# Patient Record
Sex: Female | Born: 1945 | Race: White | Hispanic: No | Marital: Married | Smoking: Former smoker
Health system: Southern US, Community
[De-identification: ages and names within clinical notes are randomized; demographics above are authoritative.]

## PROBLEM LIST (undated history)

## (undated) DIAGNOSIS — G2581 Restless legs syndrome: Secondary | ICD-10-CM

## (undated) DIAGNOSIS — F419 Anxiety disorder, unspecified: Secondary | ICD-10-CM

## (undated) DIAGNOSIS — G20 Parkinson's disease: Secondary | ICD-10-CM

## (undated) DIAGNOSIS — G709 Myoneural disorder, unspecified: Secondary | ICD-10-CM

## (undated) DIAGNOSIS — R7303 Prediabetes: Secondary | ICD-10-CM

## (undated) DIAGNOSIS — R42 Dizziness and giddiness: Secondary | ICD-10-CM

## (undated) DIAGNOSIS — F329 Major depressive disorder, single episode, unspecified: Secondary | ICD-10-CM

## (undated) DIAGNOSIS — Z9889 Other specified postprocedural states: Secondary | ICD-10-CM

## (undated) DIAGNOSIS — I10 Essential (primary) hypertension: Secondary | ICD-10-CM

## (undated) DIAGNOSIS — D649 Anemia, unspecified: Secondary | ICD-10-CM

## (undated) DIAGNOSIS — K219 Gastro-esophageal reflux disease without esophagitis: Secondary | ICD-10-CM

## (undated) DIAGNOSIS — R112 Nausea with vomiting, unspecified: Secondary | ICD-10-CM

## (undated) DIAGNOSIS — J449 Chronic obstructive pulmonary disease, unspecified: Secondary | ICD-10-CM

## (undated) DIAGNOSIS — F32A Depression, unspecified: Secondary | ICD-10-CM

## (undated) DIAGNOSIS — G4730 Sleep apnea, unspecified: Secondary | ICD-10-CM

## (undated) DIAGNOSIS — K649 Unspecified hemorrhoids: Secondary | ICD-10-CM

## (undated) HISTORY — DX: Dizziness and giddiness: R42

## (undated) HISTORY — DX: Sleep apnea, unspecified: G47.30

## (undated) HISTORY — DX: Unspecified hemorrhoids: K64.9

## (undated) HISTORY — DX: Essential (primary) hypertension: I10

## (undated) HISTORY — DX: Parkinson's disease: G20

## (undated) HISTORY — PX: APPENDECTOMY: SHX54

## (undated) HISTORY — PX: ABDOMINAL HYSTERECTOMY: SHX81

---

## 2004-10-09 ENCOUNTER — Ambulatory Visit: Payer: Self-pay | Admitting: General Surgery

## 2005-02-13 ENCOUNTER — Ambulatory Visit: Payer: Self-pay | Admitting: Family Medicine

## 2006-08-01 ENCOUNTER — Ambulatory Visit: Payer: Self-pay | Admitting: Family Medicine

## 2007-08-08 ENCOUNTER — Ambulatory Visit: Payer: Self-pay | Admitting: Family Medicine

## 2007-08-20 ENCOUNTER — Ambulatory Visit: Payer: Self-pay | Admitting: Family Medicine

## 2008-05-28 HISTORY — PX: COLONOSCOPY: SHX174

## 2008-08-04 ENCOUNTER — Ambulatory Visit: Payer: Self-pay | Admitting: Family Medicine

## 2009-03-23 ENCOUNTER — Ambulatory Visit: Payer: Self-pay | Admitting: Family Medicine

## 2009-08-09 ENCOUNTER — Ambulatory Visit: Payer: Self-pay | Admitting: Family Medicine

## 2010-08-22 ENCOUNTER — Ambulatory Visit: Payer: Self-pay | Admitting: Family Medicine

## 2010-09-21 ENCOUNTER — Ambulatory Visit: Payer: Self-pay | Admitting: Family Medicine

## 2010-10-17 ENCOUNTER — Ambulatory Visit: Payer: Self-pay | Admitting: General Surgery

## 2010-10-19 LAB — PATHOLOGY REPORT

## 2010-11-28 ENCOUNTER — Ambulatory Visit: Payer: Self-pay | Admitting: Family Medicine

## 2011-04-17 ENCOUNTER — Ambulatory Visit: Payer: Self-pay | Admitting: General Surgery

## 2011-09-06 ENCOUNTER — Ambulatory Visit: Payer: Self-pay | Admitting: Family Medicine

## 2012-05-28 DIAGNOSIS — G20 Parkinson's disease: Secondary | ICD-10-CM

## 2012-05-28 DIAGNOSIS — G20A1 Parkinson's disease without dyskinesia, without mention of fluctuations: Secondary | ICD-10-CM

## 2012-05-28 HISTORY — DX: Parkinson's disease without dyskinesia, without mention of fluctuations: G20.A1

## 2012-05-28 HISTORY — DX: Parkinson's disease: G20

## 2012-09-09 ENCOUNTER — Ambulatory Visit: Payer: Self-pay | Admitting: Family Medicine

## 2013-09-08 ENCOUNTER — Ambulatory Visit (INDEPENDENT_AMBULATORY_CARE_PROVIDER_SITE_OTHER): Payer: BC Managed Care – PPO | Admitting: General Surgery

## 2013-09-08 ENCOUNTER — Encounter: Payer: Self-pay | Admitting: General Surgery

## 2013-09-08 VITALS — BP 130/70 | HR 72 | Resp 14 | Ht 67.0 in | Wt 211.0 lb

## 2013-09-08 DIAGNOSIS — K625 Hemorrhage of anus and rectum: Secondary | ICD-10-CM

## 2013-09-08 LAB — POC HEMOCCULT BLD/STL (OFFICE/1-CARD/DIAGNOSTIC): Fecal Occult Blood, POC: NEGATIVE

## 2013-09-08 NOTE — Progress Notes (Signed)
Patient ID: Lynn Bauer, female   DOB: 06/25/1945, 68 y.o.   MRN: [id number]  Chief Complaint  Patient presents with  . Other    hemorrhoids    HPI Lynn Bauer is a 68 y.o. female here today for a evaluation of hemorrhoids.Patient states they have been there for about 30 years.She states they bleed off and on. "Bright red blood" Have not notice any in her stool.She states she is always constipated, not having any abdominal pain.  The patient was started on Linzess one month ago. She has noticed a slight improvement in her bowel habits.  She had been making use of fiber supplements on an as needed basis.    HPI  Past Medical History  Diagnosis Date  . Hypertension   . Diabetes mellitus without complication   . Parkinson disease 2014  . Vertigo   . Hemorrhoids     Past Surgical History  Procedure Laterality Date  . Colonoscopy  2010  . Abdominal hysterectomy      No family history on file.  Social History History  Substance Use Topics  . Smoking status: Never Smoker   . Smokeless tobacco: Never Used  . Alcohol Use: No    Allergies  Allergen Reactions  . Erythromycin Hives  . Penicillins Hives    Current Outpatient Prescriptions  Medication Sig Dispense Refill  . ALPRAZolam (XANAX) 0.5 MG tablet Take 0.5 mg by mouth 3 (three) times daily as needed for anxiety.      Marland Kitchen. aspirin 81 MG tablet Take 81 mg by mouth daily.      . cholecalciferol (VITAMIN D) 400 UNITS TABS tablet Take 400 Units by mouth daily.      Marland Kitchen. docusate sodium (COLACE) 100 MG capsule Take 100 mg by mouth 2 (two) times daily.      . Linaclotide (LINZESS) 145 MCG CAPS capsule Take 145 mcg by mouth daily.      . Misc Natural Products (FIBER 7 PO) Take 2 tablets by mouth daily.      Marland Kitchen. PARoxetine (PAXIL) 10 MG tablet Take 10 mg by mouth 3 (three) times daily.      . rosuvastatin (CRESTOR) 10 MG tablet Take 10 mg by mouth daily.      Marland Kitchen. telmisartan (MICARDIS) 80 MG tablet Take 80 mg by mouth daily.      Marland Kitchen.  triamterene-hydrochlorothiazide (MAXZIDE) 75-50 MG per tablet Take 0.5 tablets by mouth daily.       No current facility-administered medications for this visit.    Review of Systems Review of Systems  Constitutional: Negative.   Respiratory: Negative.   Cardiovascular: Negative.   Gastrointestinal: Positive for constipation. Negative for nausea, vomiting, abdominal pain, diarrhea and abdominal distention.    Blood pressure 130/70, pulse 72, resp. rate 14, height 5\' 7"  (1.702 m), weight 211 lb (95.709 kg).  Physical Exam Physical Exam  Constitutional: She is oriented to person, place, and time. She appears well-developed and well-nourished.  Eyes: Conjunctivae are normal.  Neck: Neck supple.  Cardiovascular: Normal rate, regular rhythm and normal heart sounds.   Pulmonary/Chest: Effort normal and breath sounds normal.  Abdominal: Soft. Normal appearance and bowel sounds are normal. There is no tenderness.  Genitourinary: Rectal exam shows internal hemorrhoid.  Neurological: She is alert and oriented to person, place, and time.  Skin: Skin is warm and dry.     Right lateral calf a 5 by 10 wound .    Data Reviewed Anoscopy showed internal hemorrhoids. Stool  is Hemoccult negative. No active bleeding. No rectal masses. No anal fissure. Minimal external component. Very slight mucosal prolapse with vigorous straining. PCP notes of 08/20/2013 reviewed.  Assessment    Anorectal source for bleeding. (Colonoscopy 2012).    Plan    The patient was asked to make use of 2 of her fiber capsules twice a day with a glass of water each time. She'll continue her Linzess. We'll plan for reassessment in one month. If we can avoid straining, she may not require surgical intervention.    PCP: Mauricia AreaMorrisey, [place]    Gisel Vipond W Adedamola Seto 09/09/2013, 4:35 PM

## 2013-09-08 NOTE — Patient Instructions (Addendum)
The patient will be encouraged to make use of a daily fiber supplement two times daily with a glass of water.

## 2013-09-09 ENCOUNTER — Ambulatory Visit: Payer: Self-pay | Admitting: Family Medicine

## 2013-09-09 DIAGNOSIS — K625 Hemorrhage of anus and rectum: Secondary | ICD-10-CM | POA: Insufficient documentation

## 2013-09-14 ENCOUNTER — Ambulatory Visit: Payer: Self-pay | Admitting: Family Medicine

## 2013-10-07 ENCOUNTER — Ambulatory Visit: Payer: BC Managed Care – PPO | Admitting: General Surgery

## 2013-12-24 ENCOUNTER — Ambulatory Visit: Payer: Self-pay | Admitting: Family Medicine

## 2014-03-25 ENCOUNTER — Ambulatory Visit: Payer: Self-pay | Admitting: Family Medicine

## 2014-03-29 ENCOUNTER — Encounter: Payer: Self-pay | Admitting: General Surgery

## 2014-05-24 ENCOUNTER — Ambulatory Visit: Payer: Self-pay | Admitting: Family Medicine

## 2014-05-31 DIAGNOSIS — S83411D Sprain of medial collateral ligament of right knee, subsequent encounter: Secondary | ICD-10-CM | POA: Diagnosis not present

## 2014-06-21 DIAGNOSIS — J069 Acute upper respiratory infection, unspecified: Secondary | ICD-10-CM | POA: Diagnosis not present

## 2014-06-21 DIAGNOSIS — J329 Chronic sinusitis, unspecified: Secondary | ICD-10-CM | POA: Diagnosis not present

## 2014-08-04 DIAGNOSIS — J309 Allergic rhinitis, unspecified: Secondary | ICD-10-CM | POA: Diagnosis not present

## 2014-08-04 DIAGNOSIS — R42 Dizziness and giddiness: Secondary | ICD-10-CM | POA: Diagnosis not present

## 2014-08-04 DIAGNOSIS — I10 Essential (primary) hypertension: Secondary | ICD-10-CM | POA: Diagnosis not present

## 2014-08-27 DIAGNOSIS — E119 Type 2 diabetes mellitus without complications: Secondary | ICD-10-CM | POA: Diagnosis not present

## 2014-10-08 DIAGNOSIS — S93621D Sprain of tarsometatarsal ligament of right foot, subsequent encounter: Secondary | ICD-10-CM | POA: Diagnosis not present

## 2014-11-16 ENCOUNTER — Encounter: Payer: Self-pay | Admitting: Family Medicine

## 2014-11-16 ENCOUNTER — Ambulatory Visit (INDEPENDENT_AMBULATORY_CARE_PROVIDER_SITE_OTHER): Payer: Medicare Other | Admitting: Family Medicine

## 2014-11-16 VITALS — BP 120/70 | HR 83 | Temp 98.2°F | Ht 68.0 in | Wt 202.0 lb

## 2014-11-16 DIAGNOSIS — R05 Cough: Secondary | ICD-10-CM

## 2014-11-16 DIAGNOSIS — J029 Acute pharyngitis, unspecified: Secondary | ICD-10-CM | POA: Diagnosis not present

## 2014-11-16 DIAGNOSIS — R059 Cough, unspecified: Secondary | ICD-10-CM

## 2014-11-16 LAB — POCT RAPID STREP A (OFFICE): Rapid Strep A Screen: NEGATIVE

## 2014-11-16 MED ORDER — CLINDAMYCIN HCL 300 MG PO CAPS
300.0000 mg | ORAL_CAPSULE | Freq: Three times a day (TID) | ORAL | Status: AC
Start: 2014-11-16 — End: 2014-11-26

## 2014-11-16 MED ORDER — BENZONATATE 200 MG PO CAPS: 200.0000 mg | ORAL_CAPSULE | Freq: Three times a day (TID) | ORAL | Status: DC | PRN

## 2014-11-16 NOTE — Progress Notes (Signed)
Name: Lynn Bauer   MRN: [id number]    DOB: 1945-12-12   Date:11/16/2014       Progress Note  Subjective  Chief Complaint  Chief Complaint  Patient presents with  . coughing    has been going on for about 2 weeks, has had sore throat and chills at night with heat spells.    Sore Throat  This is a new problem. The current episode started 1 to 4 weeks ago. The problem has been unchanged. The pain is worse on the right (middle) side. There has been no fever. Associated symptoms include coughing, ear pain, headaches and a hoarse voice. Pertinent negatives include no shortness of breath or swollen glands. She has had exposure to strep (at a graduation party for kindergartners.). She has tried acetaminophen for the symptoms. The treatment provided mild relief.      Past Medical History  Diagnosis Date  . Hypertension   . Diabetes mellitus without complication   . Parkinson disease 2014  . Vertigo   . Hemorrhoids     Past Surgical History  Procedure Laterality Date  . Colonoscopy  2010  . Abdominal hysterectomy      History reviewed. No pertinent family history.  History   Social History  . Marital Status: Married    Spouse Name: N/A  . Number of Children: N/A  . Years of Education: N/A   Occupational History  . Not on file.   Social History Main Topics  . Smoking status: Never Smoker   . Smokeless tobacco: Never Used  . Alcohol Use: No  . Drug Use: No  . Sexual Activity: Not on file   Other Topics Concern  . Not on file   Social History Narrative     Current outpatient prescriptions:  .  ALPRAZolam (XANAX) 0.5 MG tablet, Take 0.5 mg by mouth 3 (three) times daily as needed for anxiety., Disp: , Rfl:  .  aspirin 81 MG tablet, Take 81 mg by mouth daily., Disp: , Rfl:  .  atorvastatin (LIPITOR) 20 MG tablet, , Disp: , Rfl:  .  cholecalciferol (VITAMIN D) 400 UNITS TABS tablet, Take 400 Units by mouth daily., Disp: , Rfl:  .  citalopram (CELEXA) 10 MG tablet, ,  Disp: , Rfl:  .  hydrochlorothiazide (HYDRODIURIL) 12.5 MG tablet, , Disp: , Rfl:  .  Linaclotide (LINZESS) 145 MCG CAPS capsule, Take 145 mcg by mouth daily., Disp: , Rfl:  .  Misc Natural Products (FIBER 7 PO), Take 2 tablets by mouth daily., Disp: , Rfl:  .  telmisartan (MICARDIS) 80 MG tablet, Take 80 mg by mouth daily., Disp: , Rfl:  .  docusate sodium (COLACE) 100 MG capsule, Take 100 mg by mouth 2 (two) times daily., Disp: , Rfl:  .  PARoxetine (PAXIL) 10 MG tablet, Take 10 mg by mouth 3 (three) times daily., Disp: , Rfl:  .  rosuvastatin (CRESTOR) 10 MG tablet, Take 10 mg by mouth daily., Disp: , Rfl:  .  triamterene-hydrochlorothiazide (MAXZIDE) 75-50 MG per tablet, Take 0.5 tablets by mouth daily., Disp: , Rfl:   Allergies  Allergen Reactions  . Erythromycin Hives  . Penicillins Hives     Review of Systems  HENT: Positive for ear pain and hoarse voice.   Respiratory: Positive for cough. Negative for shortness of breath.   Neurological: Positive for headaches.      Objective  Filed Vitals:   11/16/14 0812  BP: 120/70  Pulse: 83  Temp: 98.2  F (36.8 C)  TempSrc: Oral  Height:  (1.727 m)  Weight: 202 lb (91.627 kg)    Physical Exam  Constitutional: She is well-developed, well-nourished, and in no distress.  HENT:  Head: Normocephalic and atraumatic.  Right Ear: External ear normal.  Left Ear: External ear normal.  Nose: Right sinus exhibits frontal sinus tenderness. Left sinus exhibits frontal sinus tenderness.  Mouth/Throat: Uvula is midline. Posterior oropharyngeal erythema present.  Neck: Neck supple.  Cardiovascular: Normal rate and regular rhythm.   Pulmonary/Chest: Effort normal and breath sounds normal.  Nursing note and vitals reviewed.      No results found for this or any previous visit (from the past 2160 hour(s)).   Assessment & Plan 1. Sore throat Point-of-care strep testing is negative. However, based on patient's symptoms and  exposure to confirmed strep carriers, we will prescribe antibiotic therapy for pharyngitis. - POCT rapid strep A - clindamycin (CLEOCIN) 300 MG capsule; Take 1 capsule (300 mg total) by mouth 3 (three) times daily.  Dispense: 30 capsule; Refill: 0  2. Cough  - benzonatate (TESSALON) 200 MG capsule; Take 1 capsule (200 mg total) by mouth 3 (three) times daily as needed for cough.  Dispense: 20 capsule; Refill: 0  There are no diagnoses linked to this encounter.  Zymir Napoli [facility] 11/16/2014 8:26 AM

## 2014-11-22 ENCOUNTER — Encounter: Payer: Self-pay | Admitting: Family Medicine

## 2014-11-22 ENCOUNTER — Telehealth: Payer: Self-pay | Admitting: Family Medicine

## 2014-11-22 ENCOUNTER — Ambulatory Visit (INDEPENDENT_AMBULATORY_CARE_PROVIDER_SITE_OTHER): Payer: Medicare Other | Admitting: Family Medicine

## 2014-11-22 DIAGNOSIS — F329 Major depressive disorder, single episode, unspecified: Secondary | ICD-10-CM

## 2014-11-22 DIAGNOSIS — F515 Nightmare disorder: Secondary | ICD-10-CM

## 2014-11-22 DIAGNOSIS — F419 Anxiety disorder, unspecified: Secondary | ICD-10-CM | POA: Diagnosis not present

## 2014-11-22 DIAGNOSIS — F32A Depression, unspecified: Secondary | ICD-10-CM | POA: Insufficient documentation

## 2014-11-22 MED ORDER — PAROXETINE HCL 10 MG PO TABS: 10.0000 mg | ORAL_TABLET | Freq: Three times a day (TID) | ORAL | Status: DC

## 2014-11-22 MED ORDER — ARIPIPRAZOLE 2 MG PO TABS: 2.0000 mg | ORAL_TABLET | Freq: Every day | ORAL | Status: DC

## 2014-11-22 NOTE — Addendum Note (Signed)
Addended by: Dennison MascotMORRISEY, Mahathi Pokorney on: 11/22/2014 02:41 PM   Modules accepted: Level of Service, SmartSet

## 2014-11-22 NOTE — Progress Notes (Signed)
Name: Lynn Bauer   MRN: [id number]    DOB: 19-Oct-1945   Date:11/22/2014       Progress Note  Subjective  Chief Complaint  Chief Complaint  Patient presents with  . Annual Exam    Anxiety Symptoms include nervous/anxious behavior. Patient reports no chest pain, dizziness, nausea, palpitations or shortness of breath.    Neurologic Problem The patient's primary symptoms include clumsiness. The patient's pertinent negatives include no focal weakness or weakness. This is a chronic problem. The current episode started more than 1 year ago. The neurological problem developed insidiously. The problem has been gradually worsening since onset. There was no focality noted. Pertinent negatives include no back pain, chest pain, dizziness, fever, headaches, nausea, neck pain, palpitations, shortness of breath or vomiting. Past treatments include nothing (Refused anti-Parkinson's drug by neurologist). Her past medical history is significant for mood changes.   NIGHTMARES  PT IS CURRENTLY EXPERIENCING VIVID NIGHTMARES THAT ALWAYS CONSIST OF SCENES OF CHILDREN GETTING LOST OR BEING IN DANGER AND SHE IS UNABLE TO GET TO THEM TO HELP,  SHE DENIES ANY RECENT TRAUMATIC EVENT.     Past Medical History  Diagnosis Date  . Hypertension   . Diabetes mellitus without complication   . Parkinson disease 2014  . Vertigo   . Hemorrhoids     History  Substance Use Topics  . Smoking status: Never Smoker   . Smokeless tobacco: Never Used  . Alcohol Use: No     Current outpatient prescriptions:  .  ALPRAZolam (XANAX) 0.5 MG tablet, Take 0.5 mg by mouth 3 (three) times daily as needed for anxiety., Disp: , Rfl:  .  aspirin 81 MG tablet, Take 81 mg by mouth daily., Disp: , Rfl:  .  atorvastatin (LIPITOR) 20 MG tablet, , Disp: , Rfl:  .  benzonatate (TESSALON) 200 MG capsule, Take 1 capsule (200 mg total) by mouth 3 (three) times daily as needed for cough., Disp: 20 capsule, Rfl: 0 .  cholecalciferol  (VITAMIN D) 400 UNITS TABS tablet, Take 400 Units by mouth daily., Disp: , Rfl:  .  citalopram (CELEXA) 10 MG tablet, , Disp: , Rfl:  .  clindamycin (CLEOCIN) 300 MG capsule, Take 1 capsule (300 mg total) by mouth 3 (three) times daily., Disp: 30 capsule, Rfl: 0 .  docusate sodium (COLACE) 100 MG capsule, Take 100 mg by mouth 2 (two) times daily., Disp: , Rfl:  .  Linaclotide (LINZESS) 145 MCG CAPS capsule, Take 145 mcg by mouth daily., Disp: , Rfl:  .  Misc Natural Products (FIBER 7 PO), Take 2 tablets by mouth daily., Disp: , Rfl:  .  PARoxetine (PAXIL) 10 MG tablet, Take 10 mg by mouth 3 (three) times daily., Disp: , Rfl:  .  rosuvastatin (CRESTOR) 10 MG tablet, Take 10 mg by mouth daily., Disp: , Rfl:  .  telmisartan (MICARDIS) 80 MG tablet, Take 80 mg by mouth daily., Disp: , Rfl:  .  triamterene-hydrochlorothiazide (MAXZIDE) 75-50 MG per tablet, Take 0.5 tablets by mouth daily., Disp: , Rfl:  .  hydrochlorothiazide (HYDRODIURIL) 12.5 MG tablet, , Disp: , Rfl:   Allergies  Allergen Reactions  . Erythromycin Hives  . Penicillins Hives    Review of Systems  Constitutional: Negative for fever, chills and weight loss.  HENT: Negative for congestion, hearing loss and tinnitus.   Eyes: Negative for blurred vision, double vision and redness.  Respiratory: Negative for cough, hemoptysis and shortness of breath.   Cardiovascular: Negative for chest  pain, palpitations, orthopnea, claudication and leg swelling.  Gastrointestinal: Negative for heartburn, nausea, vomiting, diarrhea, constipation and blood in stool.  Genitourinary: Negative for dysuria, urgency, frequency and hematuria.  Musculoskeletal: Positive for joint pain. Negative for myalgias, back pain, falls and neck pain.  Skin: Negative for itching.  Neurological: Positive for tremors (secondary to Parkinson's disease for which she has refused treatment). Negative for dizziness, tingling, focal weakness, seizures, loss of  consciousness, weakness and headaches.  Endo/Heme/Allergies: Does not bruise/bleed easily.  Psychiatric/Behavioral: Negative for depression and substance abuse. The patient is nervous/anxious.      Objective  Filed Vitals:   11/22/14 1332  BP: 120/76  Pulse: 78  Temp: 98.6 F (37 C)  TempSrc: Oral  Resp: 16  Height:  (1.727 m)  Weight: 201 lb 4.8 oz (91.309 kg)  SpO2: 97%     Physical Exam  Constitutional: She is oriented to person, place, and time and well-developed, well-nourished, and in no distress.  HENT:  Head: Normocephalic.  Eyes: EOM are normal. Pupils are equal, round, and reactive to light.  Neck: Normal range of motion. No thyromegaly present.  Cardiovascular: Normal rate, regular rhythm and normal heart sounds.   No murmur heard. Pulmonary/Chest: Effort normal and breath sounds normal.  Abdominal: Soft. Bowel sounds are normal.  Musculoskeletal: Normal range of motion. She exhibits no edema.  Neurological: She is alert and oriented to person, place, and time. No cranial nerve deficit. Gait normal.  Skin: Skin is warm and dry. No rash noted.  Psychiatric: Memory and affect normal.      Assessment & Plan  1. Depression  still not at baseline - ARIPiprazole (ABILIFY) 2 MG tablet; Take 1 tablet (2 mg total) by mouth daily.  Dispense: 30 tablet; Refill: 1 - PARoxetine (PAXIL) 10 MG tablet; Take 1 tablet (10 mg total) by mouth 3 (three) times daily.  Dispense: 90 tablet; Refill: 1  2. Acute anxiety Switch from celexa back to PAXIL - PARoxetine (PAXIL) 10 MG tablet; Take 1 tablet (10 mg total) by mouth 3 (three) times daily.  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  Dispense: 30 tablet; Refill: 1

## 2014-11-22 NOTE — Telephone Encounter (Signed)
Pt went to pharmacy to pick up rx for Abilify and when she tried to use the discount card, they told her that she was out of the age range (6-64) to use it. Rx will be $100/mo and pt said that she wasn't able to pay that so she won't be taking the rx. She wanted to make the MD aware.

## 2014-11-22 NOTE — Patient Instructions (Addendum)
FU in 1 mo.

## 2014-12-21 ENCOUNTER — Other Ambulatory Visit: Payer: Self-pay

## 2014-12-21 NOTE — Telephone Encounter (Signed)
Refill request was sent to Dr. Edwena Felty for approval and submission.  ALPRAZolam 0.5MG , 1/2 -1 Tablet two times daily, as needed, #60, 30 days starting 09/24/2014, Ref. x2. Active. Comments: DX: 300.3 Recorded 09/24/2014 11:40 AM by Edwena Felty, MD, Annotation/Addendum.

## 2014-12-22 ENCOUNTER — Other Ambulatory Visit: Payer: Self-pay

## 2014-12-22 MED ORDER — ALPRAZOLAM 0.5 MG PO TABS: 0.5000 mg | ORAL_TABLET | Freq: Three times a day (TID) | ORAL | Status: DC | PRN

## 2014-12-23 ENCOUNTER — Ambulatory Visit: Payer: Medicare Other | Admitting: Family Medicine

## 2015-01-13 ENCOUNTER — Telehealth: Payer: Self-pay | Admitting: Family Medicine

## 2015-01-13 MED ORDER — TELMISARTAN 80 MG PO TABS: 80.0000 mg | ORAL_TABLET | Freq: Every day | ORAL | Status: DC

## 2015-01-13 NOTE — Telephone Encounter (Signed)
Patient informed. 

## 2015-01-13 NOTE — Telephone Encounter (Signed)
Patient is requesting a refill on Telmisartan . Please send to [facility].

## 2015-01-13 NOTE — Telephone Encounter (Signed)
Sent to pharmacy and called pharmacy to verify receipt

## 2015-01-27 ENCOUNTER — Other Ambulatory Visit: Payer: Self-pay

## 2015-01-27 ENCOUNTER — Encounter: Payer: Self-pay | Admitting: Family Medicine

## 2015-01-27 ENCOUNTER — Encounter (INDEPENDENT_AMBULATORY_CARE_PROVIDER_SITE_OTHER): Payer: Self-pay

## 2015-01-27 ENCOUNTER — Ambulatory Visit (INDEPENDENT_AMBULATORY_CARE_PROVIDER_SITE_OTHER): Payer: Medicare Other | Admitting: Family Medicine

## 2015-01-27 VITALS — BP 130/72 | HR 78 | Temp 98.8°F | Resp 16 | Ht 68.0 in | Wt 200.9 lb

## 2015-01-27 DIAGNOSIS — Z23 Encounter for immunization: Secondary | ICD-10-CM

## 2015-01-27 DIAGNOSIS — Z0000 Encounter for general adult medical examination without abnormal findings: Secondary | ICD-10-CM | POA: Diagnosis not present

## 2015-01-27 MED ORDER — ATORVASTATIN CALCIUM 20 MG PO TABS: 20.0000 mg | ORAL_TABLET | Freq: Every day | ORAL | Status: DC

## 2015-01-27 NOTE — Patient Instructions (Signed)
RTO in one to 2 months for lab work and routine chronic disease follow-up

## 2015-01-27 NOTE — Progress Notes (Signed)
Name: Lynn Bauer   MRN: [id number]    DOB: May 21, 1946   Date:01/27/2015       Progress Note  Subjective  Chief Complaint  Chief Complaint  Patient presents with  . Annual Exam    HPI  69 year old white female presenting for annual H&P. Her baseline problems as per history of present illness are basically unchanged.  Past Medical History  Diagnosis Date  . Hypertension   . Diabetes mellitus without complication   . Parkinson disease 2014  . Vertigo   . Hemorrhoids     Social History  Substance Use Topics  . Smoking status: Never Smoker   . Smokeless tobacco: Never Used  . Alcohol Use: No     Current outpatient prescriptions:  .  ALPRAZolam (XANAX) 0.5 MG tablet, Take 1 tablet (0.5 mg total) by mouth 3 (three) times daily as needed for anxiety., Disp: 90 tablet, Rfl: 0 .  ARIPiprazole (ABILIFY) 2 MG tablet, Take 1 tablet (2 mg total) by mouth daily., Disp: 30 tablet, Rfl: 1 .  aspirin 81 MG tablet, Take 81 mg by mouth daily., Disp: , Rfl:  .  atorvastatin (LIPITOR) 20 MG tablet, Take 1 tablet (20 mg total) by mouth at bedtime., Disp: 30 tablet, Rfl: 3 .  benzonatate (TESSALON) 200 MG capsule, Take 1 capsule (200 mg total) by mouth 3 (three) times daily as needed for cough., Disp: 20 capsule, Rfl: 0 .  cholecalciferol (VITAMIN D) 400 UNITS TABS tablet, Take 400 Units by mouth daily., Disp: , Rfl:  .  docusate sodium (COLACE) 100 MG capsule, Take 100 mg by mouth 2 (two) times daily., Disp: , Rfl:  .  hydrochlorothiazide (HYDRODIURIL) 12.5 MG tablet, , Disp: , Rfl:  .  Linaclotide (LINZESS) 145 MCG CAPS capsule, Take 145 mcg by mouth daily., Disp: , Rfl:  .  Misc Natural Products (FIBER 7 PO), Take 2 tablets by mouth daily., Disp: , Rfl:  .  PARoxetine (PAXIL) 10 MG tablet, Take 1 tablet (10 mg total) by mouth 3 (three) times daily., Disp: 90 tablet, Rfl: 1 .  rosuvastatin (CRESTOR) 10 MG tablet, Take 10 mg by mouth daily., Disp: , Rfl:  .  telmisartan (MICARDIS) 80 MG tablet,  Take 1 tablet (80 mg total) by mouth daily., Disp: 30 tablet, Rfl: 2 .  triamterene-hydrochlorothiazide (MAXZIDE) 75-50 MG per tablet, Take 0.5 tablets by mouth daily., Disp: , Rfl:   Allergies  Allergen Reactions  . Erythromycin Hives  . Penicillins Hives    Review of Systems  Constitutional: Negative for fever, chills and weight loss.  HENT: Negative for congestion, hearing loss, sore throat and tinnitus.   Eyes: Negative for blurred vision, double vision and redness.  Respiratory: Negative for cough, hemoptysis and shortness of breath.   Cardiovascular: Negative for chest pain, palpitations, orthopnea, claudication and leg swelling.  Gastrointestinal: Negative for heartburn, nausea, vomiting, diarrhea, constipation and blood in stool.  Genitourinary: Negative for dysuria, urgency, frequency and hematuria.  Musculoskeletal: Negative for myalgias, back pain, joint pain, falls and neck pain.  Skin: Negative for itching.  Neurological: Negative for dizziness, tingling, tremors, focal weakness, seizures, loss of consciousness, weakness and headaches.  Endo/Heme/Allergies: Does not bruise/bleed easily.  Psychiatric/Behavioral: Negative for depression and substance abuse. The patient is not nervous/anxious and does not have insomnia.      Objective  Filed Vitals:   01/27/15 1316  BP: 130/72  Pulse: 78  Temp: 98.8 F (37.1 C)  TempSrc: Oral  Resp: 16  Height:  5\' 8"  (1.727 m)  Weight: 200 lb 14.4 oz (91.128 kg)  SpO2: 96%     Physical Exam  Constitutional: She is oriented to person, place, and time and well-developed, well-nourished, and in no distress.  HENT:  Head: Normocephalic.  Eyes: EOM are normal. Pupils are equal, round, and reactive to light.  Neck: Normal range of motion. No thyromegaly present.  Cardiovascular: Normal rate, regular rhythm and normal heart sounds.   No murmur heard. Pulmonary/Chest: Effort normal and breath sounds normal.  Breasts are without  murmur to dimpling or axillary adenopathy.  Abdominal: Soft. Bowel sounds are normal.  Musculoskeletal: Normal range of motion. She exhibits no edema.  Neurological: She is alert and oriented to person, place, and time. No cranial nerve deficit.  Parkinson's type tremor is present with cogwheel rigidity. Somewhat masked facies  Skin: Skin is warm and dry. No rash noted.  Psychiatric: Memory and affect normal.      Assessment & Plan  1. Annual physical exam   2. Need for influenza vaccination  - Flu vaccine HIGH DOSE PF (Fluzone High dose)   3.  Parkinson's disease. Patient still refusing treatment

## 2015-02-02 ENCOUNTER — Other Ambulatory Visit: Payer: Self-pay

## 2015-02-02 MED ORDER — HYDROCHLOROTHIAZIDE 12.5 MG PO TABS: 12.5000 mg | ORAL_TABLET | Freq: Every day | ORAL | Status: DC

## 2015-02-08 ENCOUNTER — Other Ambulatory Visit: Payer: Self-pay

## 2015-02-08 MED ORDER — ALPRAZOLAM 0.5 MG PO TABS: 0.5000 mg | ORAL_TABLET | Freq: Three times a day (TID) | ORAL | Status: DC | PRN

## 2015-02-28 ENCOUNTER — Encounter: Payer: Self-pay | Admitting: Family Medicine

## 2015-02-28 ENCOUNTER — Ambulatory Visit (INDEPENDENT_AMBULATORY_CARE_PROVIDER_SITE_OTHER): Payer: Medicare Other | Admitting: Family Medicine

## 2015-02-28 VITALS — BP 118/68 | HR 97 | Temp 98.9°F | Resp 16 | Ht 68.0 in | Wt 197.4 lb

## 2015-02-28 DIAGNOSIS — H9319 Tinnitus, unspecified ear: Secondary | ICD-10-CM | POA: Diagnosis not present

## 2015-02-28 DIAGNOSIS — R51 Headache: Secondary | ICD-10-CM | POA: Diagnosis not present

## 2015-02-28 DIAGNOSIS — R238 Other skin changes: Secondary | ICD-10-CM

## 2015-02-28 DIAGNOSIS — R251 Tremor, unspecified: Secondary | ICD-10-CM

## 2015-02-28 DIAGNOSIS — R233 Spontaneous ecchymoses: Secondary | ICD-10-CM

## 2015-02-28 DIAGNOSIS — G20 Parkinson's disease: Secondary | ICD-10-CM | POA: Diagnosis not present

## 2015-02-28 DIAGNOSIS — R519 Headache, unspecified: Secondary | ICD-10-CM

## 2015-02-28 LAB — GLUCOSE, POCT (MANUAL RESULT ENTRY): POC Glucose: 108 mg/dl — AB (ref 70–99)

## 2015-02-28 NOTE — Progress Notes (Signed)
Name: Lynn Bauer   MRN: [id number]    DOB: April 17, 1946   Date:02/28/2015       Progress Note  Subjective  Chief Complaint  Chief Complaint  Patient presents with  . Blurred Vision  . Tinnitus    HPI  Tinnitus.  Patient complains of recurrent problem with tenderness. It is bilateral. There is no significant decrease in her hearing. There is no ear pain. It is of note as noted below she has Parkinson's disease as well  Visual problem.  Patient complains of recurrent problem with visual difficulty. She has had her glasses changed recently but still has trouble with focusing and is seeing double at times. There is no flashing lights. It is of it is of significance that she is also experiencing some headaches.  Parkinson's disease  Patient was diagnosed with Parkinson's disease several years ago. At that time she elected to to forego long-term therapy because of side effects she was experiencing. Over the years she is having increasing problem with tremor and now is having this more mental status problems with more forgetfulness more changes in her mood and attitude as well as increasing problem with ataxia and her ambulation. She is also experiencing headache of proportion she's never experienced before.  Headaches patient complains of bilateral headaches which are more severe than she's had before. There is no problem with any flashing lights but she is experiencing also some problem with some difficulty with her vision. She has been checked by an ophthalmologist and found no problems. Is not nervous about she said coronary disease.     Past Medical History  Diagnosis Date  . Hypertension   . Diabetes mellitus without complication ([place])   . Parkinson disease ([place]) 2014  . Vertigo   . Hemorrhoids     Social History  Substance Use Topics  . Smoking status: Never Smoker   . Smokeless tobacco: Never Used  . Alcohol Use: No     Current outpatient prescriptions:  .  ALPRAZolam  (XANAX) 0.5 MG tablet, Take 1 tablet (0.5 mg total) by mouth 3 (three) times daily as needed for anxiety., Disp: 90 tablet, Rfl: 0 .  aspirin 81 MG tablet, Take 81 mg by mouth daily., Disp: , Rfl:  .  atorvastatin (LIPITOR) 20 MG tablet, Take 1 tablet (20 mg total) by mouth at bedtime., Disp: 30 tablet, Rfl: 3 .  cholecalciferol (VITAMIN D) 400 UNITS TABS tablet, Take 400 Units by mouth daily., Disp: , Rfl:  .  docusate sodium (COLACE) 100 MG capsule, Take 100 mg by mouth 2 (two) times daily., Disp: , Rfl:  .  hydrochlorothiazide (HYDRODIURIL) 12.5 MG tablet, Take 1 tablet (12.5 mg total) by mouth daily., Disp: 30 tablet, Rfl: 3 .  Linaclotide (LINZESS) 145 MCG CAPS capsule, Take 145 mcg by mouth daily., Disp: , Rfl:  .  PARoxetine (PAXIL) 10 MG tablet, Take 1 tablet (10 mg total) by mouth 3 (three) times daily., Disp: 90 tablet, Rfl: 1 .  telmisartan (MICARDIS) 80 MG tablet, Take 1 tablet (80 mg total) by mouth daily., Disp: 30 tablet, Rfl: 2 .  ARIPiprazole (ABILIFY) 2 MG tablet, Take 1 tablet (2 mg total) by mouth daily. (Patient not taking: Reported on 02/28/2015), Disp: 30 tablet, Rfl: 1 .  benzonatate (TESSALON) 200 MG capsule, Take 1 capsule (200 mg total) by mouth 3 (three) times daily as needed for cough. (Patient not taking: Reported on 02/28/2015), Disp: 20 capsule, Rfl: 0 .  Misc Natural Products (FIBER  7 PO), Take 2 tablets by mouth daily., Disp: , Rfl:  .  rosuvastatin (CRESTOR) 10 MG tablet, Take 10 mg by mouth daily., Disp: , Rfl:  .  triamterene-hydrochlorothiazide (MAXZIDE) 75-50 MG per tablet, Take 0.5 tablets by mouth daily., Disp: , Rfl:   Allergies  Allergen Reactions  . Erythromycin Hives  . Penicillins Hives    Review of Systems  Constitutional: Positive for malaise/fatigue. Negative for fever, chills and weight loss.  HENT: Positive for tinnitus. Negative for congestion, hearing loss and sore throat.   Eyes: Positive for blurred vision, double vision and photophobia.  Negative for pain, discharge and redness.  Respiratory: Negative for cough, hemoptysis and shortness of breath.   Cardiovascular: Negative for chest pain, palpitations, orthopnea, claudication and leg swelling.  Gastrointestinal: Negative for heartburn, nausea, vomiting, diarrhea, constipation and blood in stool.  Genitourinary: Negative for dysuria, urgency, frequency and hematuria.  Musculoskeletal: Positive for joint pain. Negative for myalgias, back pain, falls and neck pain.  Skin: Negative for itching.  Neurological: Positive for dizziness, tremors, sensory change, speech change, weakness and headaches. Negative for tingling, focal weakness, seizures and loss of consciousness.  Endo/Heme/Allergies: Bruises/bleeds easily.  Psychiatric/Behavioral: Positive for depression and memory loss. Negative for suicidal ideas and substance abuse. The patient is nervous/anxious. The patient does not have insomnia.      Objective  Filed Vitals:   02/28/15 1510  BP: 118/68  Pulse: 97  Temp: 98.9 F (37.2 C)  Resp: 16  Height: _0  (1.727 m)  Weight: 197 lb 6 oz (89.529 kg)  SpO2: 99%     Physical Exam  Constitutional: She is well-developed, well-nourished, and in no distress.  Patient with gross tremor particularly of the right upper extremity consistent with Parkinson's disease. There is also masking of the face is with decreased smile and frowning ability  HENT:  Head: Normocephalic.  No temporal artery tenderness  Eyes: EOM are normal. Pupils are equal, round, and reactive to light.  Neck: Normal range of motion. No thyromegaly present.  Cardiovascular: Normal rate, regular rhythm and normal heart sounds.   No murmur heard. Pulmonary/Chest: Effort normal and breath sounds normal.  Abdominal: Soft. Bowel sounds are normal.  Musculoskeletal: Normal range of motion. She exhibits no edema.  Neurological: No cranial nerve deficit.  Patient has a gross right upper extremity Parkinson's  type tremor with pill-rolling in the right upper extremity. There is also some mild cogwheel rigidity of the right upper extremity. There is decreased expression ability and the face. With ambulation she has mild ataxia and a broad-based turn with ambulation. There is also decrease in tremor with intention. This is particularly responsive finger to nose movement.  Skin: Skin is warm and dry. No rash noted.  Psychiatric: Memory and affect normal.      Assessment & Plan  1. Tinnitus, unspecified laterality Stop aspirin for now  2. Parkinson's disease ([place]) Refer back to neurologist - CBC - Comprehensive Metabolic Panel (CMET) - Lipid Profile - TSH - POCT Glucose (CBG) - POCT HgB A1C  3. Tremor Refer back to neurologist - CBC - Comprehensive Metabolic Panel (CMET) - Lipid Profile - TSH - POCT Glucose (CBG) - POCT HgB A1C  4. Nonintractable headache, unspecified chronicity pattern, unspecified headache type Refer back to neurologist will be getting a workup for temporal arteritis - Sed Rate (ESR) - Methylmalonic Acid - Ambulatory referral to Neurology - POCT Glucose (CBG) - POCT HgB A1C  5. Easy bruisability Hold aspirin for now

## 2015-03-01 DIAGNOSIS — R51 Headache: Secondary | ICD-10-CM | POA: Diagnosis not present

## 2015-03-01 DIAGNOSIS — R251 Tremor, unspecified: Secondary | ICD-10-CM | POA: Diagnosis not present

## 2015-03-01 DIAGNOSIS — G20 Parkinson's disease: Secondary | ICD-10-CM | POA: Diagnosis not present

## 2015-03-01 LAB — CBC
HEMOGLOBIN: 12.6 g/dL (ref 11.1–15.9)
Hematocrit: 37.6 % (ref 34.0–46.6)
MCH: 30.7 pg (ref 26.6–33.0)
MCHC: 33.5 g/dL (ref 31.5–35.7)
MCV: 92 fL (ref 79–97)
NRBC: 0 % (ref 0–0)
Platelets: 180 10*3/uL (ref 150–379)
RBC: 4.11 x10E6/uL (ref 3.77–5.28)
RDW: 12.9 % (ref 12.3–15.4)
WBC: 4 10*3/uL (ref 3.4–10.8)

## 2015-03-01 LAB — SEDIMENTATION RATE: SED RATE: 3 mm/h (ref 0–40)

## 2015-03-02 LAB — COMPREHENSIVE METABOLIC PANEL
ALBUMIN: 4.4 g/dL (ref 3.6–4.8)
ALT: 12 IU/L (ref 0–32)
AST: 14 IU/L (ref 0–40)
Albumin/Globulin Ratio: 1.7 (ref 1.1–2.5)
Alkaline Phosphatase: 72 IU/L (ref 39–117)
BILIRUBIN TOTAL: 0.6 mg/dL (ref 0.0–1.2)
BUN / CREAT RATIO: 14 (ref 11–26)
BUN: 13 mg/dL (ref 8–27)
CHLORIDE: 99 mmol/L (ref 97–108)
CO2: 25 mmol/L (ref 18–29)
Calcium: 9.7 mg/dL (ref 8.7–10.3)
Creatinine, Ser: 0.92 mg/dL (ref 0.57–1.00)
GFR calc non Af Amer: 64 mL/min/{1.73_m2} (ref >59)
GFR, EST AFRICAN AMERICAN: 73 mL/min/{1.73_m2} (ref >59)
GLOBULIN, TOTAL: 2.6 g/dL (ref 1.5–4.5)
Glucose: 108 mg/dL — ABNORMAL HIGH (ref 65–99)
Potassium: 3.9 mmol/L (ref 3.5–5.2)
SODIUM: 141 mmol/L (ref 134–144)
Total Protein: 7 g/dL (ref 6.0–8.5)

## 2015-03-02 LAB — LIPID PANEL
CHOLESTEROL TOTAL: 143 mg/dL (ref 100–199)
Chol/HDL Ratio: 1.9 ratio units (ref 0.0–4.4)
HDL: 76 mg/dL (ref >39)
LDL Calculated: 47 mg/dL (ref 0–99)
Triglycerides: 100 mg/dL (ref 0–149)
VLDL Cholesterol Cal: 20 mg/dL (ref 5–40)

## 2015-03-02 LAB — TSH: TSH: 1.09 u[IU]/mL (ref 0.450–4.500)

## 2015-03-04 LAB — METHYLMALONIC ACID, SERUM: Methylmalonic Acid: 129 nmol/L (ref 0–378)

## 2015-03-07 ENCOUNTER — Encounter: Payer: Self-pay | Admitting: Family Medicine

## 2015-03-08 ENCOUNTER — Telehealth: Payer: Self-pay | Admitting: Emergency Medicine

## 2015-03-08 NOTE — Telephone Encounter (Signed)
Patient notified

## 2015-03-22 ENCOUNTER — Other Ambulatory Visit: Payer: Self-pay | Admitting: Family Medicine

## 2015-03-30 ENCOUNTER — Telehealth: Payer: Self-pay | Admitting: Family Medicine

## 2015-03-30 MED ORDER — LINACLOTIDE 145 MCG PO CAPS: 145.0000 ug | ORAL_CAPSULE | Freq: Every day | ORAL | Status: DC

## 2015-03-30 MED ORDER — ALPRAZOLAM 0.5 MG PO TABS: 0.5000 mg | ORAL_TABLET | Freq: Three times a day (TID) | ORAL | Status: DC | PRN

## 2015-03-30 NOTE — Telephone Encounter (Signed)
Next Appt: 04-07-15 Completely out of [place] and is also needing a refill on Alprazolam. Please send to [facility].

## 2015-04-07 ENCOUNTER — Ambulatory Visit: Payer: Medicare Other | Admitting: Family Medicine

## 2015-04-14 DIAGNOSIS — G20 Parkinson's disease: Secondary | ICD-10-CM | POA: Diagnosis not present

## 2015-04-14 DIAGNOSIS — E6609 Other obesity due to excess calories: Secondary | ICD-10-CM | POA: Diagnosis not present

## 2015-04-18 ENCOUNTER — Other Ambulatory Visit: Payer: Self-pay | Admitting: Emergency Medicine

## 2015-04-18 MED ORDER — TELMISARTAN 80 MG PO TABS: 80.0000 mg | ORAL_TABLET | Freq: Every day | ORAL | Status: DC

## 2015-04-19 ENCOUNTER — Other Ambulatory Visit: Payer: Self-pay | Admitting: Family Medicine

## 2015-04-19 NOTE — Telephone Encounter (Signed)
Patient requesting refill. 

## 2015-04-26 ENCOUNTER — Other Ambulatory Visit: Payer: Self-pay | Admitting: Family Medicine

## 2015-05-06 ENCOUNTER — Other Ambulatory Visit: Payer: Self-pay | Admitting: Family Medicine

## 2015-05-23 ENCOUNTER — Other Ambulatory Visit: Payer: Self-pay | Admitting: Family Medicine

## 2015-06-03 ENCOUNTER — Other Ambulatory Visit: Payer: Self-pay | Admitting: Family Medicine

## 2015-07-04 ENCOUNTER — Ambulatory Visit: Payer: Medicare Other | Admitting: Family Medicine

## 2015-07-20 ENCOUNTER — Other Ambulatory Visit: Payer: Self-pay

## 2015-07-27 ENCOUNTER — Other Ambulatory Visit: Payer: Self-pay | Admitting: Family Medicine

## 2015-08-16 ENCOUNTER — Other Ambulatory Visit: Payer: Self-pay | Admitting: Family Medicine

## 2015-08-19 ENCOUNTER — Other Ambulatory Visit: Payer: Self-pay

## 2015-08-19 MED ORDER — ALPRAZOLAM 0.5 MG PO TABS: ORAL_TABLET | ORAL | Status: AC

## 2015-08-23 ENCOUNTER — Telehealth: Payer: Self-pay | Admitting: Family Medicine

## 2015-08-23 NOTE — Telephone Encounter (Signed)
PLEASE CALL PT FOR SHE NEEDS TO BE ADVISED AS TO WHO CAN SEE HER.

## 2015-08-24 NOTE — Telephone Encounter (Signed)
Spoke with pt about what was going on with her. She stated that she has been having strange dreams and feeling shaky. Pt stated that [facility]. I looked up side effects of the medication and it did list strange dreams as a serious side effect so I informed pt and asked [facility] to [facility].

## 2015-08-29 ENCOUNTER — Other Ambulatory Visit: Payer: Self-pay

## 2015-08-29 ENCOUNTER — Other Ambulatory Visit: Payer: Self-pay | Admitting: Family Medicine

## 2015-08-29 DIAGNOSIS — Z1231 Encounter for screening mammogram for malignant neoplasm of breast: Secondary | ICD-10-CM

## 2015-08-29 MED ORDER — PAROXETINE HCL 10 MG PO TABS: 10.0000 mg | ORAL_TABLET | Freq: Three times a day (TID) | ORAL | Status: DC

## 2015-09-07 ENCOUNTER — Other Ambulatory Visit: Payer: Self-pay | Admitting: Family Medicine

## 2015-09-07 ENCOUNTER — Ambulatory Visit
Admission: RE | Admit: 2015-09-07 | Discharge: 2015-09-07 | Disposition: A | Discharge status: Home / Self Care | Payer: Medicare Other | Source: Ambulatory Visit | Attending: Family Medicine | Admitting: Family Medicine

## 2015-09-07 DIAGNOSIS — Z1231 Encounter for screening mammogram for malignant neoplasm of breast: Secondary | ICD-10-CM

## 2015-09-15 ENCOUNTER — Other Ambulatory Visit: Payer: Self-pay

## 2015-09-15 DIAGNOSIS — E119 Type 2 diabetes mellitus without complications: Secondary | ICD-10-CM | POA: Diagnosis not present

## 2015-09-15 DIAGNOSIS — G20 Parkinson's disease: Secondary | ICD-10-CM

## 2015-09-15 DIAGNOSIS — R251 Tremor, unspecified: Secondary | ICD-10-CM

## 2015-09-16 ENCOUNTER — Other Ambulatory Visit: Payer: Self-pay | Admitting: Family Medicine

## 2015-09-16 DIAGNOSIS — G20 Parkinson's disease: Secondary | ICD-10-CM | POA: Diagnosis not present

## 2015-09-16 DIAGNOSIS — R251 Tremor, unspecified: Secondary | ICD-10-CM | POA: Diagnosis not present

## 2015-09-17 LAB — COMPREHENSIVE METABOLIC PANEL
ALT: 24 IU/L (ref 0–32)
AST: 20 IU/L (ref 0–40)
Albumin/Globulin Ratio: 1.7 (ref 1.2–2.2)
Albumin: 4.5 g/dL (ref 3.6–4.8)
Alkaline Phosphatase: 87 IU/L (ref 39–117)
BUN/Creatinine Ratio: 13 (ref 12–28)
BUN: 12 mg/dL (ref 8–27)
Bilirubin Total: 0.7 mg/dL (ref 0.0–1.2)
CALCIUM: 9.8 mg/dL (ref 8.7–10.3)
CO2: 24 mmol/L (ref 18–29)
CREATININE: 0.96 mg/dL (ref 0.57–1.00)
Chloride: 97 mmol/L (ref 96–106)
GFR calc Af Amer: 70 mL/min/{1.73_m2} (ref >59)
GFR, EST NON AFRICAN AMERICAN: 61 mL/min/{1.73_m2} (ref >59)
Globulin, Total: 2.7 g/dL (ref 1.5–4.5)
Glucose: 100 mg/dL — ABNORMAL HIGH (ref 65–99)
Potassium: 4.2 mmol/L (ref 3.5–5.2)
Sodium: 137 mmol/L (ref 134–144)
Total Protein: 7.2 g/dL (ref 6.0–8.5)

## 2015-09-17 LAB — CBC
Hematocrit: 40.7 % (ref 34.0–46.6)
Hemoglobin: 13.9 g/dL (ref 11.1–15.9)
MCH: 30.9 pg (ref 26.6–33.0)
MCHC: 34.2 g/dL (ref 31.5–35.7)
MCV: 90 fL (ref 79–97)
Platelets: 189 10*3/uL (ref 150–379)
RBC: 4.5 x10E6/uL (ref 3.77–5.28)
RDW: 13.4 % (ref 12.3–15.4)
WBC: 5 10*3/uL (ref 3.4–10.8)

## 2015-09-17 LAB — LIPID PANEL
Chol/HDL Ratio: 1.8 ratio units (ref 0.0–4.4)
Cholesterol, Total: 148 mg/dL (ref 100–199)
HDL: 81 mg/dL (ref >39)
LDL CALC: 49 mg/dL (ref 0–99)
Triglycerides: 92 mg/dL (ref 0–149)
VLDL CHOLESTEROL CAL: 18 mg/dL (ref 5–40)

## 2015-09-17 LAB — HEMOGLOBIN A1C
ESTIMATED AVERAGE GLUCOSE: 117 mg/dL
HEMOGLOBIN A1C: 5.7 % — AB (ref 4.8–5.6)

## 2015-09-19 ENCOUNTER — Encounter: Payer: Self-pay | Admitting: Family Medicine

## 2015-09-26 ENCOUNTER — Telehealth: Payer: Self-pay | Admitting: Family Medicine

## 2015-09-26 NOTE — Telephone Encounter (Signed)
PT ASKING FOR RESULTS OF LABS °

## 2015-09-27 ENCOUNTER — Other Ambulatory Visit: Payer: Self-pay | Admitting: Family Medicine

## 2015-09-27 NOTE — Telephone Encounter (Signed)
Pt is aware of results and copy has been mailed

## 2015-09-28 ENCOUNTER — Telehealth: Payer: Self-pay | Admitting: Family Medicine

## 2015-09-28 NOTE — Telephone Encounter (Signed)
Pt needs refill on Paxil and is asking for a 90 day supply.

## 2015-09-29 MED ORDER — PAROXETINE HCL 10 MG PO TABS: 10.0000 mg | ORAL_TABLET | Freq: Three times a day (TID) | ORAL | Status: DC

## 2015-09-29 NOTE — Telephone Encounter (Signed)
Refill sent to pt pharmacy 

## 2015-09-29 NOTE — Telephone Encounter (Signed)
LVM to inform pt of med refill.

## 2015-10-05 ENCOUNTER — Other Ambulatory Visit: Payer: Self-pay | Admitting: Family Medicine

## 2015-10-12 DIAGNOSIS — G20 Parkinson's disease: Secondary | ICD-10-CM | POA: Diagnosis not present

## 2015-10-12 DIAGNOSIS — G4733 Obstructive sleep apnea (adult) (pediatric): Secondary | ICD-10-CM | POA: Diagnosis not present

## 2015-10-19 DIAGNOSIS — Z0000 Encounter for general adult medical examination without abnormal findings: Secondary | ICD-10-CM | POA: Diagnosis not present

## 2015-10-19 DIAGNOSIS — G20 Parkinson's disease: Secondary | ICD-10-CM | POA: Diagnosis not present

## 2015-11-01 ENCOUNTER — Encounter: Payer: Self-pay | Admitting: *Deleted

## 2015-11-07 ENCOUNTER — Ambulatory Visit: Payer: Self-pay | Admitting: General Surgery

## 2015-12-12 ENCOUNTER — Ambulatory Visit: Payer: Self-pay | Admitting: General Surgery

## 2016-01-09 ENCOUNTER — Encounter: Payer: Self-pay | Admitting: General Surgery

## 2016-01-10 ENCOUNTER — Ambulatory Visit (INDEPENDENT_AMBULATORY_CARE_PROVIDER_SITE_OTHER): Payer: Medicare Other | Admitting: General Surgery

## 2016-01-10 ENCOUNTER — Encounter: Payer: Self-pay | Admitting: General Surgery

## 2016-01-10 VITALS — BP 138/78 | HR 76 | Resp 12 | Ht 68.0 in | Wt 209.0 lb

## 2016-01-10 DIAGNOSIS — K648 Other hemorrhoids: Secondary | ICD-10-CM

## 2016-01-10 DIAGNOSIS — Z86010 Personal history of colonic polyps: Secondary | ICD-10-CM | POA: Diagnosis not present

## 2016-01-10 MED ORDER — POLYETHYLENE GLYCOL 3350 17 GM/SCOOP PO POWD: 1.0000 | Freq: Once | ORAL | 0 refills | Status: AC

## 2016-01-10 NOTE — Patient Instructions (Addendum)
Colonoscopy A colonoscopy is an exam to look at the entire large intestine (colon). This exam can help find problems such as tumors, polyps, inflammation, and areas of bleeding. The exam takes about 1 hour.  LET [facility] KNOW ABOUT:   Any allergies you have.  All medicines you are taking, including vitamins, herbs, eye drops, creams, and over-the-counter medicines.  Previous problems you or members of your family have had with the use of anesthetics.  Any blood disorders you have.  Previous surgeries you have had.  Medical conditions you have. RISKS AND COMPLICATIONS  Generally, this is a safe procedure. However, as with any procedure, complications can occur. Possible complications include:  Bleeding.  Tearing or rupture of the colon wall.  Reaction to medicines given during the exam.  Infection (rare). BEFORE THE PROCEDURE   Ask your health care provider about changing or stopping your regular medicines.  You may be prescribed an oral bowel prep. This involves drinking a large amount of medicated liquid, starting the day before your procedure. The liquid will cause you to have multiple loose stools until your stool is almost clear or light green. This cleans out your colon in preparation for the procedure.  Do not eat or drink anything else once you have started the bowel prep, unless your health care provider tells you it is safe to do so.  Arrange for someone to drive you home after the procedure. PROCEDURE   You will be given medicine to help you relax (sedative).  You will lie on your side with your knees bent.  A long, flexible tube with a light and camera on the end (colonoscope) will be inserted through the rectum and into the colon. The camera sends video back to a computer screen as it moves through the colon. The colonoscope also releases carbon dioxide gas to inflate the colon. This helps your health care provider see the area better.  During  the exam, your health care provider may take a small tissue sample (biopsy) to be examined under a microscope if any abnormalities are found.  The exam is finished when the entire colon has been viewed. AFTER THE PROCEDURE   Do not drive for 24 hours after the exam.  You may have a small amount of blood in your stool.  You may pass moderate amounts of gas and have mild abdominal cramping or bloating. This is caused by the gas used to inflate your colon during the exam.  Ask when your test results will be ready and how you will get your results. Make sure you get your test results.   This information is not intended to replace advice given to you by your health care provider. Make sure you discuss any questions you have with your health care provider.   Document Released: 05/11/2000 Document Revised: 03/04/2013 Document Reviewed: 01/19/2013 Elsevier Interactive Patient Education [employer]2016 [employer].  The patient is scheduled for a Colon[facility]. They are aware to call the day before to get their arrival time. She will only take her blood pressure medication at 6 am with a small sip of water the morning of.  Miralax prescription has been sent into the patient's pharmacy. The patient is aware of date and instructions.

## 2016-01-10 NOTE — H&P (Addendum)
Patient ID: Lynn Bauer, female   DOB: 03/09/1946, 70 y.o.   MRN: [id number]  Chief Complaint  Patient presents with  . Colonoscopy    HPI Lynn Bauer is a 70 y.o. female here today for an evaluation for a colonoscopy. Her last one was 2012. Patient reports occasional constipation. She takes Linzess once a day and Metamucil 3 times a day which helps move her bowels. She states she is having some trouble with her hemorrhoids with bleeding.  The patient declined anorectal exam today.  HPI  Past Medical History:  Diagnosis Date  . Diabetes mellitus without complication ([facility])   . Hemorrhoids   . Hypertension   . Parkinson disease ([facility]) 2014  . Sleep apnea    Uses a C-Pap machine  . Vertigo     Past Surgical History:  Procedure Laterality Date  . ABDOMINAL HYSTERECTOMY    . COLONOSCOPY  2010    Family History  Problem Relation Age of Onset  . Breast cancer Neg Hx     Social History Social History  Substance Use Topics  . Smoking status: Never Smoker  . Smokeless tobacco: Never Used  . Alcohol use No    Allergies  Allergen Reactions  . Erythromycin Hives  . Penicillins Hives    Current Outpatient Prescriptions  Medication Sig Dispense Refill  . ALPRAZolam (XANAX) 0.5 MG tablet TAKE ONE TABLET THREE TIMES DAILY AS NEEDED FOR ANXIETY 90 tablet 2  . atorvastatin (LIPITOR) 20 MG tablet TAKE ONE TABLET AT BEDTIME 30 tablet 7  . benzonatate (TESSALON) 200 MG capsule Take 1 capsule (200 mg total) by mouth 3 (three) times daily as needed for cough. 20 capsule 0  . cholecalciferol (VITAMIN D) 400 UNITS TABS tablet Take 400 Units by mouth daily.    Marland Kitchen. LINZESS 145 MCG CAPS capsule TAKE ONE CAPSULE DAILY 30 capsule 0  . Misc Natural Products (FIBER 7 PO) Take 2 tablets by mouth daily.    Marland Kitchen. PARoxetine (PAXIL) 10 MG tablet Take 1 tablet (10 mg total) by mouth 3 (three) times daily. 180 tablet 0  . rosuvastatin (CRESTOR) 10 MG tablet Take 10 mg by mouth daily.    Marland Kitchen. telmisartan  (MICARDIS) 80 MG tablet TAKE 1 TABLET EVERY DAY 30 tablet 5  . triamterene-hydrochlorothiazide (MAXZIDE) 75-50 MG per tablet Take 0.5 tablets by mouth daily.     No current facility-administered medications for this visit.     Review of Systems Review of Systems  Constitutional: Negative.   Respiratory: Negative.   Cardiovascular: Negative.     Blood pressure 138/78, pulse 76, resp. rate 12, height 5\' 8"  (1.727 m), weight 209 lb (94.8 kg).  Physical Exam Physical Exam  Constitutional: She is oriented to person, place, and time. She appears well-developed and well-nourished.  Cardiovascular: Normal rate and regular rhythm.   Pulmonary/Chest: Effort normal and breath sounds normal.  Neurological: She is alert and oriented to person, place, and time.  Skin: Skin is warm and dry.    Data Reviewed Colonoscopy dated 10/17/2010 showed 2 polyps in the sigmoid colon. Pathology showed these to be hyperplastic.  Colonoscopy completed in 2006 had showed a serrated adenoma.  Assessment    Past history serrated adenoma.  Intermittent rectal bleeding, likely anorectal source.    Plan    The patient declined anorectal exam, requesting this be completed at the time of her colonoscopy. In the past more age were identified between these were not entirely a sensate and banding was  not indicated. She was informed that if appropriate lesions are identified for banding it will be completed at time of her colonoscopy.    Colonoscopy with possible biopsy/polypectomy prn: Information regarding the procedure, including its potential risks and complications (including but not limited to perforation of the bowel, which may require emergency surgery to repair, and bleeding) was verbally given to the patient. Educational information regarding lower intestinal endoscopy was given to the patient. Written instructions for how to complete the bowel prep using Miralax were provided. The importance of drinking  ample fluids to avoid dehydration as a result of the prep emphasized.  The patient is [facility]. They are aware to call the day before to get their arrival time. She will only take her blood pressure medication at 6 am with a small sip of water the morning of.  Miralax prescription has been sent into the patient's pharmacy. The patient is aware of date and instructions.    This information has been scribed by Ples SpecterJessica Bauer CMA.  Earline MayotteByrnett, Lynn Bauer 01/11/2016, 5:06 PM

## 2016-01-13 NOTE — Addendum Note (Signed)
Addended by: Earline MayotteBYRNETT, JEFFREY W on: 01/13/2016 08:13 PM   Modules accepted: Orders, SmartSet

## 2016-02-01 ENCOUNTER — Encounter
Admission: RE | Disposition: A | Discharge status: Home / Self Care | Payer: Self-pay | Source: Ambulatory Visit | Attending: General Surgery

## 2016-02-01 ENCOUNTER — Ambulatory Visit: Payer: Medicare Other | Admitting: Certified Registered Nurse Anesthetist

## 2016-02-01 ENCOUNTER — Encounter: Payer: Self-pay | Admitting: *Deleted

## 2016-02-01 ENCOUNTER — Ambulatory Visit
Admission: RE | Admit: 2016-02-01 | Discharge: 2016-02-01 | Disposition: A | Discharge status: Home / Self Care | Payer: Medicare Other | Source: Ambulatory Visit | Attending: General Surgery | Admitting: General Surgery

## 2016-02-01 DIAGNOSIS — K641 Second degree hemorrhoids: Secondary | ICD-10-CM

## 2016-02-01 DIAGNOSIS — G4730 Sleep apnea, unspecified: Secondary | ICD-10-CM | POA: Insufficient documentation

## 2016-02-01 DIAGNOSIS — Z79899 Other long term (current) drug therapy: Secondary | ICD-10-CM | POA: Insufficient documentation

## 2016-02-01 DIAGNOSIS — I10 Essential (primary) hypertension: Secondary | ICD-10-CM | POA: Diagnosis not present

## 2016-02-01 DIAGNOSIS — G20 Parkinson's disease: Secondary | ICD-10-CM | POA: Insufficient documentation

## 2016-02-01 DIAGNOSIS — Z86010 Personal history of colonic polyps: Secondary | ICD-10-CM | POA: Diagnosis not present

## 2016-02-01 DIAGNOSIS — K625 Hemorrhage of anus and rectum: Secondary | ICD-10-CM | POA: Insufficient documentation

## 2016-02-01 DIAGNOSIS — Z9989 Dependence on other enabling machines and devices: Secondary | ICD-10-CM | POA: Diagnosis not present

## 2016-02-01 DIAGNOSIS — E119 Type 2 diabetes mellitus without complications: Secondary | ICD-10-CM | POA: Insufficient documentation

## 2016-02-01 HISTORY — DX: Major depressive disorder, single episode, unspecified: F32.9

## 2016-02-01 HISTORY — DX: Anxiety disorder, unspecified: F41.9

## 2016-02-01 HISTORY — DX: Depression, unspecified: F32.A

## 2016-02-01 HISTORY — DX: Myoneural disorder, unspecified: G70.9

## 2016-02-01 HISTORY — PX: HEMORRHOID BANDING: SHX5850

## 2016-02-01 HISTORY — PX: COLONOSCOPY WITH PROPOFOL: SHX5780

## 2016-02-01 SURGERY — COLONOSCOPY WITH PROPOFOL
Anesthesia: General

## 2016-02-01 MED ORDER — PROPOFOL 10 MG/ML IV BOLUS
INTRAVENOUS | Status: DC | PRN
  Administered 2016-02-01: 20 mg via INTRAVENOUS

## 2016-02-01 MED ORDER — GLYCOPYRROLATE 0.2 MG/ML IJ SOLN
INTRAMUSCULAR | Status: DC | PRN
  Administered 2016-02-01: 0.2 mg via INTRAVENOUS

## 2016-02-01 MED ORDER — PROPOFOL 500 MG/50ML IV EMUL
INTRAVENOUS | Status: DC | PRN
  Administered 2016-02-01: 140 ug/kg/min via INTRAVENOUS

## 2016-02-01 MED ORDER — PHENYLEPHRINE HCL 10 MG/ML IJ SOLN
INTRAMUSCULAR | Status: DC | PRN
  Administered 2016-02-01 (×2): 50 ug via INTRAVENOUS

## 2016-02-01 MED ORDER — SODIUM CHLORIDE 0.9 % IV SOLN
INTRAVENOUS | Status: DC
  Administered 2016-02-01: 11:00:00 via INTRAVENOUS
  Administered 2016-02-01: 1000 mL via INTRAVENOUS

## 2016-02-01 MED ORDER — LIDOCAINE HCL (CARDIAC) 20 MG/ML IV SOLN
INTRAVENOUS | Status: DC | PRN
Start: 2016-02-01 — End: 2016-02-01
  Administered 2016-02-01: 40 mg via INTRAVENOUS

## 2016-02-01 NOTE — Anesthesia Postprocedure Evaluation (Signed)
Anesthesia Post Note  Patient: Olivia Mackieoni P Cerami  Procedure(s) Performed: Procedure(s) (LRB): COLONOSCOPY WITH PROPOFOL (N/A) HEMORRHOID BANDING (N/A)  Patient location during evaluation: Endoscopy Anesthesia Type: General Level of consciousness: awake and alert Pain management: pain level controlled Vital Signs Assessment: post-procedure vital signs reviewed and stable Respiratory status: spontaneous breathing, nonlabored ventilation, respiratory function stable and patient connected to nasal cannula oxygen Cardiovascular status: blood pressure returned to baseline and stable Postop Assessment: no signs of nausea or vomiting Anesthetic complications: no    Last Vitals:  Vitals:   02/01/16 1235 02/01/16 1245  BP: 126/72   Pulse:  73  Resp:  15  Temp:      Last Pain:  Vitals:   02/01/16 1235  TempSrc: Tympanic                 Lenard SimmerAndrew Angele Wiemann

## 2016-02-01 NOTE — Transfer of Care (Signed)
Immediate Anesthesia Transfer of Care Note  Patient: Lynn Bauer P Schmid  Procedure(s) Performed: Procedure(s): COLONOSCOPY WITH PROPOFOL (N/A) HEMORRHOID BANDING (N/A)  Patient Location: PACU and Endoscopy Unit  Anesthesia Type:General  Level of Consciousness: awake  Airway & Oxygen Therapy: Patient Spontanous Breathing  Post-op Assessment: Report given to RN  Post vital signs: stable  Last Vitals:  Vitals:   02/01/16 1010  BP: 129/62  Pulse: 84  Resp: 20  Temp: 36.8 C    Last Pain:  Vitals:   02/01/16 1010  TempSrc: Tympanic         Complications: No apparent anesthesia complications

## 2016-02-01 NOTE — Anesthesia Preprocedure Evaluation (Signed)
Anesthesia Evaluation  Patient identified by MRN, date of birth, ID band Patient awake    Reviewed: Allergy & Precautions, H&P , NPO status , Patient's Chart, lab work & pertinent test results, reviewed documented beta blocker date and time   History of Anesthesia Complications Negative for: history of anesthetic complications  Airway Mallampati: IV  TM Distance: >3 FB Neck ROM: full    Dental no notable dental hx. (+) Caps, Poor Dentition   Pulmonary neg shortness of breath, sleep apnea and Continuous Positive Airway Pressure Ventilation , neg COPD, neg recent URI,    Pulmonary exam normal breath sounds clear to auscultation       Cardiovascular Exercise Tolerance: Good hypertension, (-) angina(-) CAD, (-) Past MI, (-) Cardiac Stents and (-) CABG Normal cardiovascular exam(-) dysrhythmias + Valvular Problems/Murmurs (as a child)  Rhythm:regular Rate:Normal     Neuro/Psych neg Seizures PSYCHIATRIC DISORDERS (Depression)  Neuromuscular disease (Parkinson's disease)    GI/Hepatic negative GI ROS, Neg liver ROS,   Endo/Other  diabetes (borderline)  Renal/GU negative Renal ROS  negative genitourinary   Musculoskeletal   Abdominal   Peds  Hematology negative hematology ROS (+)   Anesthesia Other Findings Past Medical History: No date: Anxiety No date: Depression No date: Diabetes mellitus without complication (H[facility]) No date: Hemorrhoids No date: Hypertension No date: Neuromuscular disorder ([facility]) 2014: Parkinson disease ([facility]) No date: Sleep apnea     Comment: Uses a C-Pap machine No date: Vertigo   Reproductive/Obstetrics negative OB ROS                             Anesthesia Physical Anesthesia Plan  ASA: II  Anesthesia Plan: General   Post-op Pain Management:    Induction:   Airway Management Planned:   Additional Equipment:   Intra-op Plan:   Post-operative Plan:    Informed Consent: I have reviewed the patients History and Physical, chart, labs and discussed the procedure including the risks, benefits and alternatives for the proposed anesthesia with the patient or authorized representative who has indicated his/her understanding and acceptance.   Dental Advisory Given  Plan Discussed with: Anesthesiologist, CRNA and Surgeon  Anesthesia Plan Comments:         Anesthesia Quick Evaluation

## 2016-02-01 NOTE — Op Note (Addendum)
Preoperative diagnosis: Internal hemorrhoids.  Postoperative diagnosis: Same.  Operative procedure: Anoscopy, hemorrhoid banding.  Operating surgeon: Lane HackerJeffery Abdullahi Vallone, M.D.  Anesthesia:  Monitored anesthesia care.  Estimated blood loss: None.  Clinical note: This 70 year old woman underwent a screening colonoscopy earlier based on a past history of adenomatous polyps as well as recent history of rectal bleeding. She declined rectal exam in the office but the clinical suspicion was for internal hemorrhoids. He identified at the time of today's colonoscopy.  Operative note: Colonoscopy showed evidence of a prominent internal hemorrhoid. Anoscopy was completed showing internal hemorrhoids at the right anterior, right posterior location. These were controlled with rubber band ligation 2 each site. No bleeding was noted.  Patient tolerated procedure well and was taken to the recovery area in stable condition.

## 2016-02-01 NOTE — H&P (Signed)
No change in cardiopulmonary exam. Tolerated prep well. Reviewed possibility of hemorrhoid banding if appropriate lesion identified.

## 2016-02-01 NOTE — Discharge Instructions (Signed)
Reviewed discharge instruction with patient and husband.

## 2016-02-01 NOTE — Op Note (Addendum)
[facility] Gastroenterology Patient Name: Lynn Bauer Procedure Date: 02/01/2016 11:16 AM MRN: [id number] Account #: [id number] Date of Birth: 06/05/1945 Admit Type: Outpatient Age: 70 [facility] Gender: Female Note Status: Finalized Procedure:            Colonoscopy Indications:          Rectal bleeding Providers:            Earline MayotteJeffrey W. Khaleed Holan, MD Referring MD:         Danella PentonMark F. Miller, MD (Referring MD) Medicines:            Monitored Anesthesia Care Complications:        No immediate complications. Procedure:            Pre-Anesthesia Assessment:                       - Prior to the procedure, a History and Physical was                        performed, and patient medications, allergies and                        sensitivities were reviewed. The patient's tolerance of                        previous anesthesia was reviewed.                       - The risks and benefits of the procedure and the                        sedation options and risks were discussed with the                        patient. All questions were answered and informed                        consent was obtained.                       After obtaining informed consent, the colonoscope was                        passed under direct vision. Throughout the procedure,                        the patient's blood pressure, pulse, and oxygen                        saturations were monitored continuously. The                        Colonoscope was introduced through the anus and                        advanced to the the cecum, identified by appendiceal                        orifice and ileocecal valve. The colonoscopy was  performed without difficulty. The patient tolerated the                        procedure well. The quality of the bowel preparation                        was excellent. Findings:      Internal hemorrhoids were found during retroflexion. The  hemorrhoids       were medium-sized and Grade II (internal hemorrhoids that prolapse but       reduce spontaneously). A hemorrhoid was isolated with anoscopy. The       O'Regan ligator was positioned over the hemorrhoid at the right anterior       position and the right posterior position. Suction was applied and one       rubber band was placed over the hemorrhoid. This was checked to make       certain that the muscularis was free of the band. Post-banding digital       rectal exam showed band in good position. There were no complications. Impression:           - Internal hemorrhoids. Banded.                       - No specimens collected. Recommendation:       - Telephone endoscopist if symptomatic in 1 week. Procedure Code(s):    --- Professional ---                       [phone], Colonoscopy, flexible; with band ligation(s)                        (eg, hemorrhoids) Diagnosis Code(s):    --- Professional ---                       K64.1, Second degree hemorrhoids                       K62.5, Hemorrhage of anus and rectum CPT copyright 2016 American [employer] Association. All rights reserved. The codes documented in this report are preliminary and upon coder review may  be revised to meet current compliance requirements. Earline Mayotte, MD 02/01/2016 11:57:28 AM This report has been signed electronically. Number of Addenda: 0 Note Initiated On: 02/01/2016 11:16 AM Scope Withdrawal Time: 0 hours 13 minutes 38 seconds  Total Procedure Duration: 0 hours 21 minutes 22 seconds       [facility]

## 2016-02-03 ENCOUNTER — Encounter: Payer: Self-pay | Admitting: General Surgery

## 2016-02-08 ENCOUNTER — Telehealth: Payer: Self-pay | Admitting: *Deleted

## 2016-02-08 NOTE — Telephone Encounter (Signed)
Patient called to give you an update after her colonoscopy and hemorrhoid banding, she stated there is still a small amount of blood after a bowel movement and still some stinging.

## 2016-02-09 NOTE — Telephone Encounter (Signed)
Patient called to state she is doing much better, she will call with any changes. She would like to follow up in one month. Appointment made.

## 2016-02-09 NOTE — Telephone Encounter (Signed)
-----   Message from Lynn MayotteJeffrey W Byrnett, MD sent at 02/09/2016  9:04 AM EDT ----- Notified patient I got her message re: bleeding and stinging post banding of hemorrhoids. Patience at present. Office f/u in one month if needed to band additional area.

## 2016-02-29 ENCOUNTER — Encounter: Payer: Self-pay | Admitting: General Surgery

## 2016-02-29 ENCOUNTER — Ambulatory Visit (INDEPENDENT_AMBULATORY_CARE_PROVIDER_SITE_OTHER): Payer: Medicare Other | Admitting: General Surgery

## 2016-02-29 VITALS — BP 140/72 | HR 72 | Resp 14 | Ht 68.0 in | Wt 202.0 lb

## 2016-02-29 DIAGNOSIS — K648 Other hemorrhoids: Secondary | ICD-10-CM

## 2016-02-29 LAB — POC HEMOCCULT BLD/STL (OFFICE/1-CARD/DIAGNOSTIC): Fecal Occult Blood, POC: NEGATIVE

## 2016-02-29 NOTE — Patient Instructions (Addendum)
The patient is aware to call back for any questions or concerns. Adjust fiber tablets as needed.

## 2016-02-29 NOTE — Progress Notes (Signed)
Patient ID: Lynn Bauer, female   DOB: 07/12/1945, 70 y.o.   MRN: [id number]  Chief Complaint  Patient presents with  . Follow-up    HPI Lynn Bauer is a 70 y.o. female.  Here today for follow up hemorrhoids, banding 02-01-16. She states she is not bleeding or having any pain.She did report significant rectal discomfort for several weeks, but has had study improvement in her bleeding. Now resolved. Bowel movements are soft.   HPI  Past Medical History:  Diagnosis Date  . Anxiety   . Depression   . Diabetes mellitus without complication (HCC)   . Hemorrhoids   . Hypertension   . Neuromuscular disorder (HCC)   . Parkinson disease (HCC) 2014  . Sleep apnea    Uses a C-Pap machine  . Vertigo     Past Surgical History:  Procedure Laterality Date  . ABDOMINAL HYSTERECTOMY    . COLONOSCOPY  2010  . COLONOSCOPY WITH PROPOFOL N/A 02/01/2016   Procedure: COLONOSCOPY WITH PROPOFOL;  Surgeon: Earline Mayotte, MD;  Location: [facility] ENDOSCOPY;  Service: Endoscopy;  Laterality: N/A;  . HEMORRHOID BANDING N/A 02/01/2016   Procedure: [place];  Surgeon: Earline Mayotte, MD;  Location: [facility] ENDOSCOPY;  Service: Endoscopy;  Laterality: N/A;    Family History  Problem Relation Age of Onset  . Breast cancer Neg Hx     Social History Social History  Substance Use Topics  . Smoking status: Never Smoker  . Smokeless tobacco: Never Used  . Alcohol use No    Allergies  Allergen Reactions  . Erythromycin Hives  . Penicillins Hives    Current Outpatient Prescriptions  Medication Sig Dispense Refill  . ALPRAZolam (XANAX) 0.5 MG tablet TAKE ONE TABLET THREE TIMES DAILY AS NEEDED FOR ANXIETY (Patient taking differently: Take 0.5 mg by mouth 3 (three) times daily. TAKE ONE TABLET THREE TIMES DAILY AS NEEDED FOR ANXIETY) 90 tablet 2  . atorvastatin (LIPITOR) 20 MG tablet TAKE ONE TABLET AT BEDTIME (Patient taking differently: TAKE ONE/half TABLET AT BEDTIME every other day) 30 tablet  7  . cholecalciferol (VITAMIN D) 400 UNITS TABS tablet Take 400 Units by mouth daily.    Marland Kitchen LINZESS 145 MCG CAPS capsule TAKE ONE CAPSULE DAILY 30 capsule 0  . Misc Natural Products (FIBER 7 PO) Take 3 tablets by mouth 3 (three) times daily.     Marland Kitchen PARoxetine (PAXIL) 10 MG tablet Take 1 tablet (10 mg total) by mouth 3 (three) times daily. 180 tablet 0  . rosuvastatin (CRESTOR) 10 MG tablet Take 10 mg by mouth daily.    Marland Kitchen telmisartan (MICARDIS) 80 MG tablet TAKE 1 TABLET EVERY DAY 30 tablet 5  . vitamin B-12 (CYANOCOBALAMIN) 1000 MCG tablet Take 1,000 mcg by mouth daily.     No current facility-administered medications for this visit.     Review of Systems Review of Systems  Constitutional: Negative.   Respiratory: Negative.   Cardiovascular: Negative.     Blood pressure 140/72, pulse 72, resp. rate 14, height 5\' 8"  (1.727 m), weight 202 lb (91.6 kg).  Physical Exam Physical Exam  Constitutional: She is oriented to person, place, and time. She appears well-developed and well-nourished.  Genitourinary: Rectal exam shows guaiac negative stool.     Neurological: She is alert and oriented to person, place, and time.  Skin: Skin is warm and dry.  Psychiatric: Her behavior is normal.       Assessment    Doing well status post hemorrhoid  banding.    Plan    Colonoscopy was negative. Consideration can be given to a repeat exam at age 70.    Follow up as needed. Adjust fiber tablets as needed. The patient is aware to call back for any questions or concerns.  This information has been scribed by Dorathy DaftMarsha Hatch RN, BSN,BC.   Earline MayotteByrnett, Kairon Shock W 03/01/2016, 4:36 PM

## 2016-03-01 DIAGNOSIS — K648 Other hemorrhoids: Secondary | ICD-10-CM | POA: Insufficient documentation

## 2016-03-06 ENCOUNTER — Encounter: Payer: Self-pay | Admitting: General Surgery

## 2016-05-28 HISTORY — PX: JOINT REPLACEMENT: SHX530

## 2016-09-28 ENCOUNTER — Emergency Department: Payer: Medicare Other

## 2016-09-28 ENCOUNTER — Encounter: Payer: Self-pay | Admitting: Emergency Medicine

## 2016-09-28 ENCOUNTER — Encounter
Admission: EM | Disposition: A | Discharge status: Home / Self Care | Payer: Self-pay | Source: Home / Self Care | Attending: Emergency Medicine

## 2016-09-28 ENCOUNTER — Emergency Department
Admission: EM | Admit: 2016-09-28 | Discharge: 2016-09-29 | Disposition: A | Discharge status: Home / Self Care | Payer: Medicare Other | Attending: Emergency Medicine | Admitting: Emergency Medicine

## 2016-09-28 DIAGNOSIS — F419 Anxiety disorder, unspecified: Secondary | ICD-10-CM | POA: Diagnosis not present

## 2016-09-28 DIAGNOSIS — I10 Essential (primary) hypertension: Secondary | ICD-10-CM | POA: Diagnosis not present

## 2016-09-28 DIAGNOSIS — S43004A Unspecified dislocation of right shoulder joint, initial encounter: Secondary | ICD-10-CM

## 2016-09-28 DIAGNOSIS — G4730 Sleep apnea, unspecified: Secondary | ICD-10-CM | POA: Insufficient documentation

## 2016-09-28 DIAGNOSIS — S42144A Nondisplaced fracture of glenoid cavity of scapula, right shoulder, initial encounter for closed fracture: Secondary | ICD-10-CM | POA: Insufficient documentation

## 2016-09-28 DIAGNOSIS — Z79899 Other long term (current) drug therapy: Secondary | ICD-10-CM | POA: Diagnosis not present

## 2016-09-28 DIAGNOSIS — G20 Parkinson's disease: Secondary | ICD-10-CM | POA: Insufficient documentation

## 2016-09-28 DIAGNOSIS — S42294A Other nondisplaced fracture of upper end of right humerus, initial encounter for closed fracture: Secondary | ICD-10-CM | POA: Insufficient documentation

## 2016-09-28 DIAGNOSIS — E119 Type 2 diabetes mellitus without complications: Secondary | ICD-10-CM | POA: Diagnosis not present

## 2016-09-28 DIAGNOSIS — S4291XA Fracture of right shoulder girdle, part unspecified, initial encounter for closed fracture: Secondary | ICD-10-CM

## 2016-09-28 DIAGNOSIS — W010XXA Fall on same level from slipping, tripping and stumbling without subsequent striking against object, initial encounter: Secondary | ICD-10-CM | POA: Diagnosis not present

## 2016-09-28 DIAGNOSIS — F329 Major depressive disorder, single episode, unspecified: Secondary | ICD-10-CM | POA: Insufficient documentation

## 2016-09-28 DIAGNOSIS — Y9301 Activity, walking, marching and hiking: Secondary | ICD-10-CM | POA: Diagnosis not present

## 2016-09-28 DIAGNOSIS — Y92009 Unspecified place in unspecified non-institutional (private) residence as the place of occurrence of the external cause: Secondary | ICD-10-CM | POA: Diagnosis not present

## 2016-09-28 HISTORY — PX: SHOULDER CLOSED REDUCTION: SHX1051

## 2016-09-28 LAB — CBC
HEMATOCRIT: 40.1 % (ref 35.0–47.0)
HEMOGLOBIN: 13.4 g/dL (ref 12.0–16.0)
MCH: 29.9 pg (ref 26.0–34.0)
MCHC: 33.4 g/dL (ref 32.0–36.0)
MCV: 89.5 fL (ref 80.0–100.0)
Platelets: 206 10*3/uL (ref 150–440)
RBC: 4.48 MIL/uL (ref 3.80–5.20)
RDW: 13.2 % (ref 11.5–14.5)
WBC: 11.4 10*3/uL — ABNORMAL HIGH (ref 3.6–11.0)

## 2016-09-28 LAB — BASIC METABOLIC PANEL
Anion gap: 10 (ref 5–15)
BUN: 19 mg/dL (ref 6–20)
CHLORIDE: 101 mmol/L (ref 101–111)
CO2: 25 mmol/L (ref 22–32)
Calcium: 9.7 mg/dL (ref 8.9–10.3)
Creatinine, Ser: 0.87 mg/dL (ref 0.44–1.00)
GFR calc Af Amer: >60 mL/min (ref >60)
GFR calc non Af Amer: >60 mL/min (ref >60)
GLUCOSE: 154 mg/dL — AB (ref 65–99)
POTASSIUM: 3.8 mmol/L (ref 3.5–5.1)
Sodium: 136 mmol/L (ref 135–145)

## 2016-09-28 LAB — PROTIME-INR
INR: 0.95
Prothrombin Time: 12.7 seconds (ref 11.4–15.2)

## 2016-09-28 LAB — APTT: aPTT: 27 seconds (ref 24–36)

## 2016-09-28 SURGERY — CLOSED REDUCTION, SHOULDER
Anesthesia: General | Laterality: Right

## 2016-09-28 MED ORDER — ONDANSETRON HCL 4 MG/2ML IJ SOLN
4.0000 mg | Freq: Once | INTRAMUSCULAR | Status: AC
  Administered 2016-09-28: 4 mg via INTRAVENOUS
  Filled 2016-09-28: qty 2

## 2016-09-28 MED ORDER — PROPOFOL 10 MG/ML IV BOLUS
INTRAVENOUS | Status: AC
  Filled 2016-09-28: qty 20

## 2016-09-28 MED ORDER — HYDROMORPHONE HCL 1 MG/ML IJ SOLN
INTRAMUSCULAR | Status: AC
  Filled 2016-09-28: qty 1

## 2016-09-28 MED ORDER — HYDROMORPHONE HCL 1 MG/ML IJ SOLN
0.5000 mg | INTRAMUSCULAR | Status: AC
  Administered 2016-09-28: 0.5 mg via INTRAVENOUS

## 2016-09-28 MED ORDER — NALOXONE HCL 2 MG/2ML IJ SOSY
PREFILLED_SYRINGE | INTRAMUSCULAR | Status: AC
  Filled 2016-09-28: qty 2

## 2016-09-28 MED ORDER — HYDROMORPHONE HCL 1 MG/ML IJ SOLN
0.5000 mg | Freq: Once | INTRAMUSCULAR | Status: AC
  Administered 2016-09-28: 0.5 mg via INTRAVENOUS

## 2016-09-28 MED ORDER — HYDROMORPHONE HCL 1 MG/ML IJ SOLN
0.5000 mg | INTRAMUSCULAR | Status: AC
  Administered 2016-09-28: 0.5 mg via INTRAVENOUS
  Filled 2016-09-28: qty 1

## 2016-09-28 SURGICAL SUPPLY — 2 items
IMMOBILIZER SHDR MD LX WHT (SOFTGOODS) ×3 IMPLANT
KIT RM TURNOVER STRD PROC AR (KITS) ×3 IMPLANT

## 2016-09-28 NOTE — ED Triage Notes (Signed)
Pt tripped and fell into bookcase. Pt denies dizziness prior to fall. Pt has Parkinson's disease. Pt reports right shoulder, right elbow and right wrist pain. Pt reports numbness and tingling to forearm and fingers. No obvious deformity noted.

## 2016-09-28 NOTE — ED Notes (Addendum)
Narcan at bedside as a precaution.

## 2016-09-28 NOTE — ED Notes (Signed)
Patient was repositioned on the stretcher, pillows placed under right arm for comfort. Ice bag placed on right upper arm.

## 2016-09-28 NOTE — ED Notes (Signed)
Patient's clothing and jewelry removed and given to husband. Pt had 2 silver colored earrings and one silver colored watch with brown leather band. Patient placed in hospital gown and covered with sheet.

## 2016-09-28 NOTE — ED Notes (Signed)
Dr. Fanny BienQuale at bedside. Patient placed on O2 via [place] for sats at 89%.

## 2016-09-28 NOTE — ED Notes (Signed)
Dr. Fanny BienQuale at bedside attempting a reduction. Patient is tolerating procedure fairly well. C/o feeling upper arm tightness afterwards. Patient is on monitor. Patient is alert and oriented, speaks easily and answers questions appropriately.

## 2016-09-28 NOTE — ED Provider Notes (Addendum)
[facility] Emergency Department Provider Note   ____________________________________________   First MD Initiated Contact with Patient 09/28/16 2107     (approximate)  I have reviewed the triage vital signs and the nursing notes.   HISTORY  Chief Complaint Fall; Shoulder Pain; and Elbow Pain    HPI Lynn Bauer is a 71 y.o. female history of Parkinson's disease  Today the patient was walking in her home, she was carrying something in her feet got tangled up in a rug, she fell over to her side, and extended her right arm to prevent herself from hitting a bookshelf. She fell onto her right shoulder with immediate pain.  She reports pain in the right shoulder, no nausea or vomiting. No numbness or tingling although she felt a tingling in her right hand but this is improved. No trouble breathing. She did not strike her head, injure her neck or anything else. She denies any loss of consciousness or preceding symptoms such as trouble breathing or chest pain    Past Medical History:  Diagnosis Date  . Anxiety   . Depression   . Diabetes mellitus without complication (HCC)   . Hemorrhoids   . Hypertension   . Neuromuscular disorder (HCC)   . Parkinson disease (HCC) 2014  . Sleep apnea    Uses a C-Pap machine  . Vertigo     Patient Active Problem List   Diagnosis Date Noted  . Internal hemorrhoids 03/01/2016  . Acute anxiety 11/22/2014  . 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 Depression 11/22/2014  . Sore throat 11/16/2014    Past Surgical History:  Procedure Laterality Date  . ABDOMINAL HYSTERECTOMY    . COLONOSCOPY  2010  . COLONOSCOPY WITH PROPOFOL N/A 02/01/2016   Procedure: COLONOSCOPY WITH PROPOFOL;  Surgeon: Earline Mayotte, MD;  Location: [facility] ENDOSCOPY;  Service: Endoscopy;  Laterality: N/A;  . HEMORRHOID BANDING N/A 02/01/2016   Procedure: [place];  Surgeon: Earline Mayotte, MD;  Location: [facility] ENDOSCOPY;  Service:  Endoscopy;  Laterality: N/A;    Prior to Admission medications   Medication Sig Start Date End Date Taking? Authorizing Provider  ALPRAZolam (XANAX) 0.5 MG tablet TAKE ONE TABLET THREE TIMES DAILY AS NEEDED FOR ANXIETY Patient taking differently: Take 0.5 mg by mouth 3 (three) times daily as needed for anxiety.  08/19/15  Yes Dennison Mascot, MD  cholecalciferol (VITAMIN D) 1000 units tablet Take 2,000 Units by mouth daily.   Yes Historical Provider, MD  hydrOXYzine (ATARAX/VISTARIL) 25 MG tablet Take 25 mg by mouth 3 (three) times daily.   Yes Historical Provider, MD  LINZESS 145 MCG CAPS capsule TAKE ONE CAPSULE DAILY 10/06/15  Yes Dennison Mascot, MD  metoprolol succinate (TOPROL-XL) 50 MG 24 hr tablet Take 50 mg by mouth daily.   Yes Historical Provider, MD  PARoxetine (PAXIL) 40 MG tablet Take 40 mg by mouth daily.   Yes Historical Provider, MD  psyllium (HYDROCIL/METAMUCIL) 95 % PACK Take 1 packet by mouth 3 (three) times daily as needed for mild constipation.   Yes Historical Provider, MD  telmisartan (MICARDIS) 80 MG tablet TAKE 1 TABLET EVERY DAY 04/19/15  Yes Dennison Mascot, MD  vitamin B-12 (CYANOCOBALAMIN) 1000 MCG tablet Take 1,000 mcg by mouth daily.   Yes Historical Provider, MD  atorvastatin (LIPITOR) 20 MG tablet TAKE ONE TABLET AT BEDTIME Patient not taking: Reported on 09/28/2016 05/24/15   Dennison Mascot, MD  Misc Natural Products (FIBER 7 PO) Take 3 tablets by mouth 3 (  three) times daily.     Historical Provider, MD  oxyCODONE (ROXICODONE) 5 MG immediate release tablet Take 1-2 tablets (5-10 mg total) by mouth every 4 (four) hours as needed for severe pain. 09/29/16   Christena Flake, MD    Allergies Erythromycin and Penicillins  Family History  Problem Relation Age of Onset  . Breast cancer Neg Hx     Social History Social History  Substance Use Topics  . Smoking status: Never Smoker  . Smokeless tobacco: Never Used  . Alcohol use No    Review of  Systems Constitutional: No fever/chills Eyes: No visual changes. ENT: No sore throat. Cardiovascular: Denies chest pain. Respiratory: Denies shortness of breath. Gastrointestinal: No abdominal pain.  No nausea, no vomiting.  No diarrhea.  No constipation. Genitourinary: Negative for dysuria. Musculoskeletal: Negative for back pain.No neck pain. Skin: Negative for rash. Neurological: Negative for headaches, focal weakness or numbness.  10-point ROS otherwise negative.  ____________________________________________   PHYSICAL EXAM:  VITAL SIGNS: ED Triage Vitals [09/28/16 1848]  Enc Vitals Group     BP (!) 153/113     Pulse Rate 62     Resp 20     Temp 97.7 F (36.5 C)     Temp Source Oral     SpO2 96 %     Weight 200 lb (90.7 kg)     Height 5\' 8"  (1.727 m)     Head Circumference      Peak Flow      Pain Score 8     Pain Loc      Pain Edu?      Excl. in GC?     Constitutional: Alert and oriented. Well appearing and in no acute distress.Does appear at least moderate pain. Reports pain in the right shoulder Eyes: Conjunctivae are normal. PERRL. EOMI. Head: Atraumatic. Nose: No congestion/rhinnorhea. Mouth/Throat: Mucous membranes are moist.  Oropharynx non-erythematous. Neck: No stridor.  No midline cervical tenderness. Full range of motion of neck without pain or discomfort. Cardiovascular: Normal rate, regular rhythm. Grossly normal heart sounds.  Good peripheral circulation. Respiratory: Normal respiratory effort.  No retractions. Lungs CTAB. Gastrointestinal: Soft and nontender. No distention.  Musculoskeletal: Right hand Median, ulnar, radial motor intact. Cap refill less than 2 seconds all digits. Strong radial pulse. 5 out of 5 strength throughout the hand intrinsics, flexion and extension at the wrist. No evidence of trauma.  Left hand Median, ulnar, radial motor intact. Cap refill less than 2 seconds all digits. Strong radial pulse. 5 out of 5 strength  throughout the hand intrinsics, flexion and extension at the wrist. No evidence of trauma. ____________________________________________  RIGHT  No edema bruising or contusions of the right shoulder/upper arm, right elbow, right forearm / handThough a slight deformity is noted of the right anterior shoulder. Strong radial pulse. Intact median/ulnar/radial neuro-muscular exam.  LEFT Left upper extremity demonstrates normal strength, good use of all muscles. No edema bruising or contusions of the left shoulder/upper arm, left elbow, left forearm / hand. Full range of motion of the left  upper extremity without pain. No evidence of trauma. Strong radial pulse. Intact median/ulnar/radial neuro-muscular exam.   Neurologic:  Normal speech and language. No gross focal neurologic deficits are appreciated. Parkinsonian facie. Moderes bilateral upper extremity tremors (patient reports chronic) Skin:  Skin is warm, dry and intact. No rash noted. Psychiatric: Mood and affect are normal. Speech and behavior are normal.  ____________________________________________   LABS (all labs ordered are listed, but  only abnormal results are displayed)  Labs Reviewed  CBC - Abnormal; Notable for the following:       Result Value   WBC 11.4 (*)    All other components within normal limits  BASIC METABOLIC PANEL - Abnormal; Notable for the following:    Glucose, Bld 154 (*)    All other components within normal limits  PROTIME-INR  APTT   ____________________________________________  EKG   ____________________________________________  RADIOLOGY    Dg Wrist Complete Right  Result Date: 09/28/2016 CLINICAL DATA:  Fall.  Right wrist pain EXAM: RIGHT WRIST - COMPLETE 3+ VIEW COMPARISON:  None FINDINGS: There is no evidence of fracture or dislocation. There is no evidence of arthropathy or other focal bone abnormality. Soft tissues are unremarkable. IMPRESSION: Negative. Electronically Signed   By: Signa Kell M.D.   On: 09/28/2016 19:32   Dg Shoulder Right Portable  Result Date: 09/28/2016 CLINICAL DATA:  Reduction attempt. EXAM: PORTABLE RIGHT SHOULDER COMPARISON:  RIGHT shoulder radiograph Sep 28, 2016 at 1859 hours FINDINGS: No interval change of the Anteroinferior RIGHT shoulder fracture dislocation including acutely fractured glenoid rim and humeral head. No destructive bony lesions. Soft tissue planes are nonsuspicious. IMPRESSION: Unchanged RIGHT shoulder fracture dislocation. Electronically Signed   By: Awilda Metro M.D.   On: 09/28/2016 23:00   Dg Humerus Right  Result Date: 09/28/2016 CLINICAL DATA:  Fall today with right shoulder pain. Initial encounter. EXAM: RIGHT HUMERUS - 2+ VIEW COMPARISON:  None. FINDINGS: Comminuted fracture of the proximal humerus with anterior dislocation of the shaft and neck. The fragments overlapping the glenoid are from uncertain donor site and CT may be needed. Intact acromioclavicular joint. IMPRESSION: Fracture and dislocation of the proximal humerus. Electronically Signed   By: Marnee Spring M.D.   On: 09/28/2016 19:31    ____________________________________________   PROCEDURES  Procedure(s) performed: right shoulder reduction  Reduction of dislocation Date/Time: 12:29 AM Performed by: Sharyn Creamer Authorized by: Sharyn Creamer Consent: Verbal consent obtained. Risks and benefits: risks, benefits and alternatives were discussed Consent given by: patient Required items:special equipment available Time out: Prior to procedure verified the correct patient, procedure, equipment, support staff   Patient sedated: hydromorphone  Vitals: Vital signs were monitored during sedation. Patient tolerance: Patient tolerated the procedure well with no immediate complications. Joint: right shoulder    Reduction technique: Fare's  Patient tolerated well, unfortunately was not successful.  Patient neurovascularly intact in the right upper extremity  after attempted reduction. Normal radial pulse, palpable.  Procedures  Critical Care performed: No  ____________________________________________   INITIAL IMPRESSION / ASSESSMENT AND PLAN / ED COURSE  Pertinent labs & imaging results that were available during my care of the patient were reviewed by me and considered in my medical decision making (see chart for details).    Patient reports after fall with isolated injury to the right shoulder. She has a proximal humerus dislocation/fracture. X-rays and the case reviewed with Dr. Joice Lofts of orthopedics, he recommends attempt at reduction in the ER and appropriate sling/splinting if successful.  ----------------------------------------- 9:27 PM on 09/28/2016 -----------------------------------------  Pain medication ordered. Patient agreeable to plan for reduction after verbal consent was obtained. Right shoulder verified. Patient verified.  Clinical Course as of Sep 30 27  Fri Sep 28, 2016  2323 Dr. Joice Lofts at bedside with patient now.  [MQ]    Clinical Course User Index [MQ] Sharyn Creamer, MD   Patient seen by Dr. Joice Lofts orthopedics, he will be taking the patient to  the operating room under his care for further evaluation and probable closed reduction.  ____________________________________________   FINAL CLINICAL IMPRESSION(S) / ED DIAGNOSES  Final diagnoses:  Closed fracture dislocation of right shoulder joint, initial encounter      NEW MEDICATIONS STARTED DURING THIS VISIT:  Current Discharge Medication List    START taking these medications   Details  oxyCODONE (ROXICODONE) 5 MG immediate release tablet Take 1-2 tablets (5-10 mg total) by mouth every 4 (four) hours as needed for severe pain. Qty: 30 tablet, Refills: 0         Note:  This document was prepared using Dragon voice recognition software and may include unintentional dictation errors.     Sharyn CreamerMark Keanen Dohse, MD 09/29/16 [id number]    Sharyn CreamerMark Shelitha Magley,  MD 09/29/16 0030

## 2016-09-28 NOTE — Consult Note (Signed)
ORTHOPAEDIC CONSULTATION  REQUESTING PHYSICIAN: Sharyn Creamer, MD  Chief Complaint:   Right shoulder injury.  History of Present Illness: Lynn Bauer is a 71 y.o. female with a history of hypertension, Parkinson's disease, diabetes, and anxiety/depression who lives independently with her husband. Apparently, while at home this afternoon, she tripped on a rug that had flipped over and pitched forward. She fell forward, she reached out to grab bookcase, resulting in a right shoulder injury. She was brought to the emergency room where x-rays demonstrated a fracture dislocation of her shoulder. After several attempts at closed reduction by the ER physician, I have been called for formal consultation. The patient denies any prior problems with the right shoulder. She does note some tingling into her fingers since this injury, but otherwise has no complaints pertaining to the right upper extremity. She did not strike her head or lose consciousness as a result of the fall. She denies any lightheadedness, dizziness, chest pain, or other predisposing factors that may have contributed to her fall.  Past Medical History:  Diagnosis Date  . Anxiety   . Depression   . Diabetes mellitus without complication (HCC)   . Hemorrhoids   . Hypertension   . Neuromuscular disorder (HCC)   . Parkinson disease (HCC) 2014  . Sleep apnea    Uses a C-Pap machine  . Vertigo    Past Surgical History:  Procedure Laterality Date  . ABDOMINAL HYSTERECTOMY    . COLONOSCOPY  2010  . COLONOSCOPY WITH PROPOFOL N/A 02/01/2016   Procedure: COLONOSCOPY WITH PROPOFOL;  Surgeon: Earline Mayotte, MD;  Location: [facility] ENDOSCOPY;  Service: Endoscopy;  Laterality: N/A;  . HEMORRHOID BANDING N/A 02/01/2016   Procedure: Chester Lions;  Surgeon: Earline Mayotte, MD;  Location: [facility] ENDOSCOPY;  Service: Endoscopy;  Laterality: N/A;   Social History   Social History   . Marital status: Married    Spouse name: N/A  . Number of children: N/A  . Years of education: N/A   Social History Main Topics  . Smoking status: Never Smoker  . Smokeless tobacco: Never Used  . Alcohol use No  . Drug use: No  . Sexual activity: Not Asked   Other Topics Concern  . None   Social History Narrative  . None   Family History  Problem Relation Age of Onset  . Breast cancer Neg Hx    Allergies  Allergen Reactions  . Erythromycin Hives  . Penicillins Hives   Prior to Admission medications   Medication Sig Start Date End Date Taking? Authorizing Provider  ALPRAZolam (XANAX) 0.5 MG tablet TAKE ONE TABLET THREE TIMES DAILY AS NEEDED FOR ANXIETY Patient taking differently: Take 0.5 mg by mouth 3 (three) times daily. TAKE ONE TABLET THREE TIMES DAILY AS NEEDED FOR ANXIETY 08/19/15   Dennison Mascot, MD  atorvastatin (LIPITOR) 20 MG tablet TAKE ONE TABLET AT BEDTIME Patient taking differently: TAKE ONE/half TABLET AT BEDTIME every other day 05/24/15   Dennison Mascot, MD  cholecalciferol (VITAMIN D) 400 UNITS TABS tablet Take 400 Units by mouth daily.    [provider]  LINZESS 145 MCG CAPS capsule TAKE ONE CAPSULE DAILY 10/06/15   Dennison Mascot, MD  Misc Natural Products (FIBER 7 PO) Take 3 tablets by mouth 3 (three) times daily.     [provider]  PARoxetine (PAXIL) 10 MG tablet Take 1 tablet (10 mg total) by mouth 3 (three) times daily. 09/29/15   Dennison Mascot, MD  rosuvastatin (CRESTOR) 10 MG  tablet Take 10 mg by mouth daily.    [provider]  telmisartan (MICARDIS) 80 MG tablet TAKE 1 TABLET EVERY DAY 04/19/15   Dennison Mascot, MD  vitamin B-12 (CYANOCOBALAMIN) 1000 MCG tablet Take 1,000 mcg by mouth daily.    [provider]   Dg Wrist Complete Right  Result Date: 09/28/2016 CLINICAL DATA:  Fall.  Right wrist pain EXAM: RIGHT WRIST - COMPLETE 3+ VIEW COMPARISON:  None FINDINGS: There is no evidence of fracture or  dislocation. There is no evidence of arthropathy or other focal bone abnormality. Soft tissues are unremarkable. IMPRESSION: Negative. Electronically Signed   By: Signa Kell M.D.   On: 09/28/2016 19:32   Dg Shoulder Right Portable  Result Date: 09/28/2016 CLINICAL DATA:  Reduction attempt. EXAM: PORTABLE RIGHT SHOULDER COMPARISON:  RIGHT shoulder radiograph Sep 28, 2016 at 1859 hours FINDINGS: No interval change of the Anteroinferior RIGHT shoulder fracture dislocation including acutely fractured glenoid rim and humeral head. No destructive bony lesions. Soft tissue planes are nonsuspicious. IMPRESSION: Unchanged RIGHT shoulder fracture dislocation. Electronically Signed   By: Awilda Metro M.D.   On: 09/28/2016 23:00   Dg Humerus Right  Result Date: 09/28/2016 CLINICAL DATA:  Fall today with right shoulder pain. Initial encounter. EXAM: RIGHT HUMERUS - 2+ VIEW COMPARISON:  None. FINDINGS: Comminuted fracture of the proximal humerus with anterior dislocation of the shaft and neck. The fragments overlapping the glenoid are from uncertain donor site and CT may be needed. Intact acromioclavicular joint. IMPRESSION: Fracture and dislocation of the proximal humerus. Electronically Signed   By: Marnee Spring M.D.   On: 09/28/2016 19:31    Positive ROS: All other systems have been reviewed and were otherwise negative with the exception of those mentioned in the HPI and as above.  Physical Exam: General:  Alert, no acute distress Psychiatric:  Patient is competent for consent with normal mood and affect   Cardiovascular:  No pedal edema Respiratory:  No wheezing, non-labored breathing GI:  Abdomen is soft and non-tender Skin:  No lesions in the area of chief complaint Neurologic:  Sensation intact distally Lymphatic:  No axillary or cervical lymphadenopathy  Orthopedic Exam:  Orthopedic examination is limited to the right upper extremity and hand. Skin inspection around the shoulder and arm is  unremarkable other than some swelling. The upper arm is somewhat shortened and there is some increased swelling/fullness anteriorly, consistent with an anterior shoulder dislocation. She has pain with any attempted active or passive motion of the shoulder. Sensation is intact to light touch to the axillary, muscular cutaneous, median, the radial, and the ulnar nerve distributions. She is able to actively flex and extend all digits. She has good capillary refill to her right hand.  X-rays:  X-rays of the right shoulder are available for review. These films demonstrated an anterior fracture dislocation of the right shoulder with a displaced large greater tuberosity fragment. There also appears be an essentially nondisplaced humeral head fragment.  Assessment: Closed fracture dislocation right glenohumeral joint.  Plan: The treatment options have been discussed with the patient and her husband, who is at the bedside. After discussing the various options, I feel that it would be best to proceed with an attempted closed reduction of the fracture dislocated shoulder in the operating room under general anesthesia so as to minimize the risk of displacing the humeral head fragment. Both the patient and her husband are agreeable with this option. The risks of this procedure (including failure of relocating the  shoulder, displacement of the fracture, recurrent dislocation, residual weakness or limitation of motion, need for further surgical intervention, blood clots, strokes, heart attacks and/or arrhythmias, etc.) and benefits have been discussed. The patient and her husband agreed to proceed. A formal written consent has been signed.  Thank you for ask me to participate in the care of this most unfortunate woman. I will be happy to keep you abreast of her progress.    Maryagnes AmosJ. Jeffrey Poggi, MD  Beeper #:  [phone]  09/28/2016 11:30 PM

## 2016-09-28 NOTE — ED Notes (Signed)
Dr. Poggi at bedside. °

## 2016-09-29 ENCOUNTER — Emergency Department: Payer: Medicare Other | Admitting: Registered Nurse

## 2016-09-29 ENCOUNTER — Emergency Department: Payer: Medicare Other

## 2016-09-29 MED ORDER — OXYCODONE HCL 5 MG PO TABS
5.0000 mg | ORAL_TABLET | ORAL | Status: DC | PRN
  Administered 2016-09-29: 10 mg via ORAL

## 2016-09-29 MED ORDER — ONDANSETRON HCL 4 MG PO TABS: 4.0000 mg | ORAL_TABLET | Freq: Four times a day (QID) | ORAL | Status: DC | PRN

## 2016-09-29 MED ORDER — ONDANSETRON HCL 4 MG/2ML IJ SOLN: 4.0000 mg | Freq: Four times a day (QID) | INTRAMUSCULAR | Status: DC | PRN

## 2016-09-29 MED ORDER — METOCLOPRAMIDE HCL 10 MG PO TABS: 5.0000 mg | ORAL_TABLET | Freq: Three times a day (TID) | ORAL | Status: DC | PRN

## 2016-09-29 MED ORDER — OXYCODONE HCL 5 MG PO TABS: 5.0000 mg | ORAL_TABLET | ORAL | 0 refills | Status: DC | PRN

## 2016-09-29 MED ORDER — METOCLOPRAMIDE HCL 5 MG/ML IJ SOLN: 5.0000 mg | Freq: Three times a day (TID) | INTRAMUSCULAR | Status: DC | PRN

## 2016-09-29 MED ORDER — PROPOFOL 10 MG/ML IV BOLUS
INTRAVENOUS | Status: DC | PRN
  Administered 2016-09-29 (×4): 20 mg via INTRAVENOUS
  Administered 2016-09-29: 70 mg via INTRAVENOUS

## 2016-09-29 MED ORDER — FENTANYL CITRATE (PF) 100 MCG/2ML IJ SOLN
25.0000 ug | INTRAMUSCULAR | Status: DC | PRN
  Administered 2016-09-29 (×4): 25 ug via INTRAVENOUS

## 2016-09-29 MED ORDER — FENTANYL CITRATE (PF) 100 MCG/2ML IJ SOLN
INTRAMUSCULAR | Status: AC
  Administered 2016-09-29: 25 ug via INTRAVENOUS
  Filled 2016-09-29: qty 2

## 2016-09-29 MED ORDER — POTASSIUM CHLORIDE IN NACL 20-0.9 MEQ/L-% IV SOLN: INTRAVENOUS | Status: DC

## 2016-09-29 MED ORDER — ONDANSETRON HCL 4 MG/2ML IJ SOLN: 4.0000 mg | Freq: Once | INTRAMUSCULAR | Status: DC | PRN

## 2016-09-29 MED ORDER — LACTATED RINGERS IV SOLN
INTRAVENOUS | Status: DC | PRN
Start: 2016-09-29 — End: 2016-09-29
  Administered 2016-09-29: via INTRAVENOUS

## 2016-09-29 MED ORDER — OXYCODONE HCL 5 MG PO TABS
ORAL_TABLET | ORAL | Status: AC
  Administered 2016-09-29: 10 mg via ORAL
  Filled 2016-09-29: qty 1

## 2016-09-29 NOTE — Anesthesia Post-op Follow-up Note (Cosign Needed)
Anesthesia QCDR form completed.        

## 2016-09-29 NOTE — Op Note (Signed)
09/28/2016 - 09/29/2016  12:35 AM  Patient:   Lynn Bauer  Pre-Op Diagnosis:   Closed anterior fracture-dislocation, right shoulder.  Post-Op Diagnosis:   Same.  Procedure:   Closed reduction of fracture-dislocation, right shoulder.  Surgeon:   Maryagnes AmosJ. Jeffrey Poggi, MD  Assistant:   None  Anesthesia:   IV sedation  Findings:   As above.  Complications:   None  Fluids:   200 cc crystalloid  EBL:   None  Closure:   None  Brief Clinical Note:   The patient is a 71 year old female with a history of Parkinson's, hypertension, and type 2 diabetes who lives independently with her husband. Apparently, she tripped on a rolled up piece of carpet and pitched forward. As she tried to catch her self on the book case in front of her, she injured her right shoulder. X-rays in the emergency room demonstrated fracture dislocation of the right shoulder. It was unable to be reduced in the emergency room. Therefore, she is being brought to the operating room for closed reduction under IV sedation and fluoroscopic imaging.  Procedure:   The patient was brought into the operating room and lain in the supine position. After adequate IV sedation was achieved, the glenohumeral fracture dislocation was reduced using a combination of traction and countertraction, utilizing fluoroscopy to help guide the reduction so as not to displace the humeral head fracture fragment. Once the shoulder was successfully reduced as confirmed by fluoroscopic imaging, the patient was placed into a shoulder immobilizer. She was then awakened and returned to the recovery room in satisfactory condition after tolerating the procedure well.

## 2016-09-29 NOTE — Discharge Instructions (Addendum)
AMBULATORY SURGERY  DISCHARGE INSTRUCTIONS   1) The drugs that you were given will stay in your system until tomorrow so for the next 24 hours you should not:  A) Drive an automobile B) Make any legal decisions C) Drink any alcoholic beverage   2) You may resume regular meals tomorrow.  Today it is better to start with liquids and gradually work up to solid foods.  You may eat anything you prefer, but it is better to start with liquids, then soup and crackers, and gradually work up to solid foods.   3) Please notify your doctor immediately if you have any unusual bleeding, trouble breathing, redness and pain at the surgery site, drainage, fever, or pain not relieved by medication.    4) Additional Instructions:        Please contact your physician with any problems or Same Day Surgery at [phone], Monday through Friday 6 am to 4 pm, or [place] at [facility] Main number at [phone].How to Use a Shoulder Immobilizer A shoulder immobilizer is a device that you may have to wear after a shoulder injury or surgery. This device keeps your arm from moving. This prevents additional pain or injury. It also supports your arm next to your body as your shoulder heals. You may need to wear a shoulder immobilizer to treat a broken bone (fracture) in your shoulder. You may also need to wear one if you have an injury that moves your shoulder out of position (dislocation). There are different types of shoulder immobilizers. The one that you get depends on your injury. What are the risks? Wearing a shoulder immobilizer in the wrong way can let your injured shoulder move around too much. This may delay healing and make your pain and swelling worse. How to use your shoulder immobilizer  The part of the immobilizer that goes around your neck (sling) should support your upper arm, with your elbow bent and your lower arm and hand across your chest.  Make sure that your elbow:  Is snug  against the back pocket of the sling.  Does not move away from your body.  The strap of the immobilizer should go over your shoulder and support your arm and hand. Your hand should be slightly higher than your elbow. It should not hang loosely over the edge of the sling.  If the long strap has a pad, place it where it is most comfortable on your neck.  Carefully follow your health care provider's instructions for wearing your shoulder immobilizer. Your health care provider may want you to:  Loosen your immobilizer to straighten your elbow and move your wrist and fingers. You may have to do this several times each day. Ask your health care provider when you should do this and how often.  Remove your immobilizer once every day to shower, but limit the movement in your injured arm. Before putting the immobilizer back on, use a towel to dry the area under your arm completely.  Remove your immobilizer to do shoulder exercises at home as directed by your health care provider.  Wear your immobilizer while you sleep. You may sleep more comfortably if you have your upper body raised on pillows. Contact a health care provider if:  Your immobilizer is not supporting your arm properly.  Your immobilizer gets damaged.  You have worsening pain or swelling in your shoulder, arm, or hand.  Your shoulder, arm, or hand changes color or temperature.  You lose feeling in your shoulder,  arm, or hand. This information is not intended to replace advice given to you by your health care provider. Make sure you discuss any questions you have with your health care provider. Document Released: 06/21/2004 Document Revised: 10/20/2015 Document Reviewed: 04/21/2014 Elsevier Interactive Patient Education  2017 Elsevier Inc.   Apply ice frequently to shoulder. Take ibuprofen 600 mg TID with meals for 7-10 days, then as necessary. Take oxycodone as prescribed when needed.  May supplement with ES Tylenol if  necessary. Keep shoulder immobilizer on at all times except may remove for bathing purposes. Follow-up in 5-7 days. Call [facility] at [phone] on Monday morning to schedule follow-up appointment with either Horris Latino or Dr. Joice Lofts.

## 2016-09-29 NOTE — Anesthesia Preprocedure Evaluation (Signed)
Anesthesia Evaluation  Patient identified by MRN, date of birth, ID band Patient awake    Reviewed: Allergy & Precautions, H&P , NPO status , Patient's Chart, lab work & pertinent test results, reviewed documented beta blocker date and time   History of Anesthesia Complications Negative for: history of anesthetic complications  Airway Mallampati: IV  TM Distance: >3 FB Neck ROM: full    Dental no notable dental hx. (+) Caps, Poor Dentition   Pulmonary neg shortness of breath, sleep apnea and Continuous Positive Airway Pressure Ventilation , neg COPD, neg recent URI,           Cardiovascular Exercise Tolerance: Good hypertension, (-) angina(-) CAD, (-) Past MI, (-) Cardiac Stents and (-) CABG (-) dysrhythmias + Valvular Problems/Murmurs (as a child)      Neuro/Psych neg Seizures PSYCHIATRIC DISORDERS (Depression)  Neuromuscular disease (Parkinson's disease)    GI/Hepatic negative GI ROS, Neg liver ROS,   Endo/Other  diabetes  Renal/GU negative Renal ROS  negative genitourinary   Musculoskeletal   Abdominal   Peds  Hematology negative hematology ROS (+)   Anesthesia Other Findings Past Medical History: No date: Anxiety No date: Depression No date: Diabetes mellitus without complication (HCC) No date: Hemorrhoids No date: Hypertension No date: Neuromuscular disorder (HCC) 2014: Parkinson disease (HCC) No date: Sleep apnea     Comment: Uses a C-Pap machine No date: Vertigo   Reproductive/Obstetrics negative OB ROS                             Anesthesia Physical  Anesthesia Plan  ASA: II  Anesthesia Plan: General   Post-op Pain Management:    Induction:   Airway Management Planned:   Additional Equipment:   Intra-op Plan:   Post-operative Plan:   Informed Consent: I have reviewed the patients History and Physical, chart, labs and discussed the procedure including the risks,  benefits and alternatives for the proposed anesthesia with the patient or authorized representative who has indicated his/her understanding and acceptance.   Dental Advisory Given  Plan Discussed with: Anesthesiologist, CRNA and Surgeon  Anesthesia Plan Comments:         Anesthesia Quick Evaluation

## 2016-09-29 NOTE — Transfer of Care (Signed)
Immediate Anesthesia Transfer of Care Note  Patient: Lynn Bauer P Dantes  Procedure(s) Performed: Procedure(s): CLOSED REDUCTION SHOULDER (Right)  Patient Location: PACU  Anesthesia Type:General  Level of Consciousness: sedated  Airway & Oxygen Therapy: Patient Spontanous Breathing and Patient connected to face mask oxygen  Post-op Assessment: Report given to RN and Post -op Vital signs reviewed and stable  Post vital signs: Reviewed and stable  Last Vitals:  Vitals:   09/28/16 2330 09/29/16 0039  BP: (!) 195/107 (!) 169/99  Pulse: 79 74  Resp: 13   Temp:  36.6 C    Complications: No apparent anesthesia complications

## 2016-09-29 NOTE — Anesthesia Procedure Notes (Signed)
Date/Time: 09/29/2016 12:08 AM Performed by: Stormy FabianURTIS, Mikaia Janvier Pre-anesthesia Checklist: Patient identified, Emergency Drugs available, Suction available and Patient being monitored Patient Re-evaluated:Patient Re-evaluated prior to inductionOxygen Delivery Method: Nasal cannula Intubation Type: IV induction Dental Injury: Teeth and Oropharynx as per pre-operative assessment  Comments: Nasal cannula with etCO2 monitoring

## 2016-09-30 ENCOUNTER — Encounter: Payer: Self-pay | Admitting: Surgery

## 2016-10-01 NOTE — Anesthesia Postprocedure Evaluation (Signed)
Anesthesia Post Note  Patient: Lynn Bauer  Procedure(s) Performed: Procedure(s) (LRB): CLOSED REDUCTION SHOULDER (Right)  Patient location during evaluation: PACU Anesthesia Type: General Level of consciousness: awake and alert Pain management: pain level controlled Vital Signs Assessment: post-procedure vital signs reviewed and stable Respiratory status: spontaneous breathing, nonlabored ventilation, respiratory function stable and patient connected to nasal cannula oxygen Cardiovascular status: blood pressure returned to baseline and stable Postop Assessment: no signs of nausea or vomiting Anesthetic complications: no     Last Vitals:  Vitals:   09/29/16 0200 09/29/16 0205  BP:  (!) 154/94  Pulse: 71 71  Resp:    Temp: 37.2 C     Last Pain:  Vitals:   09/29/16 0205  TempSrc:   PainSc: 2                  Lenard SimmerAndrew Sandralee Tarkington

## 2016-10-03 ENCOUNTER — Encounter
Admission: RE | Admit: 2016-10-03 | Discharge: 2016-10-03 | Disposition: A | Discharge status: Home / Self Care | Payer: Medicare Other | Source: Ambulatory Visit | Attending: Surgery | Admitting: Surgery

## 2016-10-03 DIAGNOSIS — S4291XA Fracture of right shoulder girdle, part unspecified, initial encounter for closed fracture: Secondary | ICD-10-CM | POA: Insufficient documentation

## 2016-10-03 DIAGNOSIS — Z0183 Encounter for blood typing: Secondary | ICD-10-CM | POA: Diagnosis not present

## 2016-10-03 DIAGNOSIS — Z01812 Encounter for preprocedural laboratory examination: Secondary | ICD-10-CM | POA: Insufficient documentation

## 2016-10-03 DIAGNOSIS — I10 Essential (primary) hypertension: Secondary | ICD-10-CM | POA: Diagnosis not present

## 2016-10-03 DIAGNOSIS — Z01818 Encounter for other preprocedural examination: Secondary | ICD-10-CM | POA: Diagnosis present

## 2016-10-03 DIAGNOSIS — X58XXXA Exposure to other specified factors, initial encounter: Secondary | ICD-10-CM | POA: Diagnosis not present

## 2016-10-03 HISTORY — DX: Other specified postprocedural states: R11.2

## 2016-10-03 HISTORY — DX: Prediabetes: R73.03

## 2016-10-03 HISTORY — DX: Gastro-esophageal reflux disease without esophagitis: K21.9

## 2016-10-03 HISTORY — DX: Other specified postprocedural states: Z98.890

## 2016-10-03 HISTORY — DX: Anemia, unspecified: D64.9

## 2016-10-03 LAB — BASIC METABOLIC PANEL
ANION GAP: 8 (ref 5–15)
BUN: 16 mg/dL (ref 6–20)
CHLORIDE: 102 mmol/L (ref 101–111)
CO2: 26 mmol/L (ref 22–32)
Calcium: 9.3 mg/dL (ref 8.9–10.3)
Creatinine, Ser: 0.79 mg/dL (ref 0.44–1.00)
GFR calc Af Amer: >60 mL/min (ref >60)
GFR calc non Af Amer: >60 mL/min (ref >60)
Glucose, Bld: 108 mg/dL — ABNORMAL HIGH (ref 65–99)
POTASSIUM: 3.9 mmol/L (ref 3.5–5.1)
Sodium: 136 mmol/L (ref 135–145)

## 2016-10-03 LAB — SURGICAL PCR SCREEN
MRSA, PCR: NEGATIVE
Staphylococcus aureus: NEGATIVE

## 2016-10-03 LAB — TYPE AND SCREEN
ABO/RH(D): B NEG
Antibody Screen: NEGATIVE

## 2016-10-03 LAB — CBC
HEMATOCRIT: 31.7 % — AB (ref 35.0–47.0)
HEMOGLOBIN: 10.9 g/dL — AB (ref 12.0–16.0)
MCH: 31 pg (ref 26.0–34.0)
MCHC: 34.5 g/dL (ref 32.0–36.0)
MCV: 89.8 fL (ref 80.0–100.0)
Platelets: 244 10*3/uL (ref 150–440)
RBC: 3.52 MIL/uL — AB (ref 3.80–5.20)
RDW: 13.1 % (ref 11.5–14.5)
WBC: 6.3 10*3/uL (ref 3.6–11.0)

## 2016-10-03 LAB — PROTIME-INR
INR: 1.09
PROTHROMBIN TIME: 14.1 s (ref 11.4–15.2)

## 2016-10-03 NOTE — Pre-Procedure Instructions (Signed)
Lab called regarding need to recollect urine.  Ann Hallaji notified.

## 2016-10-03 NOTE — OR Nursing (Signed)
Patient and husband instructed in need for recollection of urine specimen. Verbalized understanding and will try to come tomorrow

## 2016-10-03 NOTE — Patient Instructions (Signed)
Your procedure is scheduled on: Tuesday Oct 09, 2016 Su procedimiento est programado para: Report to SECOND OF THE MEDICAL MALL COME THRU THE REVOLVING DOOR Presntese a: To find out your arrival time please call [phone] between 1PM - 3PM on Monday 10-08-2016 Para saber su hora de llegada por favor llame al [phone] entre la 1PM - 3PM el da:  Remember: Instructions that are not followed completely may result in serious medical risk, up to and including death, or upon the discretion of your surgeon and anesthesiologist your surgery may need to be rescheduled.  Recuerde: Las instrucciones que no se siguen completamente Armed forces logistics/support/administrative officer en un riesgo de salud grave, incluyendo hasta la [place] o a discrecin de su cirujano y Scientific laboratory technician, su ciruga se puede posponer.   __X__ 1. Do not eat food or drink liquids after midnight. No gum chewing or hard candies.  No coma alimentos ni tome lquidos despus de la medianoche.  No mastique chicle ni caramelos  duros.     __X__ 2. No alcohol for 24 hours before or after surgery.    No tome alcohol durante las 24 horas antes ni despus de la [place].   __X__ 3. Bring all medications with you on the day of surgery if instructed. BRING ANY NEW MEDICATIONS    Lleve todos los medicamentos con usted el da de su ciruga si se le ha indicado as.   __X__ 4. Notify your doctor if there is any change in your medical condition (cold, fever,                             infections).    Informe a su mdico si hay algn cambio en su condicin mdica (resfriado, fiebre, infecciones).   Do not wear jewelry, make-up, hairpins, clips or nail polish.  No use joyas, maquillajes, pinzas/ganchos para el cabello ni esmalte de uas.  Do not wear lotions, powders, or perfumes. You may wear NOT deodorant.  No use lociones, polvos o perfumes.  Puede usar desodorante.    Do not shave 48 hours prior to surgery. Men may shave face and neck.  No se afeite 48 horas  antes de la [place].  Los hombres pueden [employer] cara y el cuello.   Do not bring valuables to the hospital.   No lleve objetos de valor al hospital.  [facility] is not responsible for any belongings or valuables.  [place] no se hace responsable de ningn tipo de pertenencias u objetos de Licensed conveyancer.               Contacts, dentures or bridgework may not be worn into surgery.  Los lentes de [place], las dentaduras postizas o puentes no se pueden usar en la [place].  Leave your suitcase in the car. After surgery it may be brought to your room.  Deje su maleta en el auto.  Despus de la ciruga podr traerla a su habitacin.  For patients admitted to the hospital, discharge time is determined by your treatment team.  Para los pacientes que sean ingresados al hospital, el tiempo en el cual se le dar de alta es determinado por su                equipo de [place].   Patients discharged the day of surgery will not be allowed to drive home. A los pacientes que se les da de alta el mismo da de la ciruga no se  les permitir conducir a casa.   Please read over the following fact sheets that you were given: Por favor lea las siguientes hojas de informacin que le dieron:   CHG INFO AND MRSA INFO   _X___ Take these medicines the morning of surgery with A SIP OF WATER:          [place] estas medicinas la maana de la ciruga con UN SORBO DE AGUA:  1.HYDROXYZINE   2.XANAX   3. METOPROLOL  4. OMEPRAZOLE - TAKE DOSE NIGHT BEFORE AND MORNING OF SURGERY      5. MICARDIS  6.PAXIL  ____ Fleet Enema (as directed)          Enema de Fleet (segn lo indicado)    _X___ Use CHG Soap as directed          Utilice el jabn de CHG segn lo indicado  ____ Use inhalers on the day of surgery          Use los inhaladores el da de la ciruga  ____ Stop metformin 2 days prior to surgery          Deje de tomar el metformin 2 das antes de la ciruga    ____ Take 1/2 of usual insulin dose the night  before surgery and none on the morning of surgery           Tome la mitad de la dosis habitual de insulina la noche antes de la [place] y no tome nada en la maana de la             ciruga  ____ Stop Coumadin/Plavix/aspirin on          Deje de tomar el Coumadin/Plavix/aspirina el da:  __X__ Stop Anti-inflammatories UNTIL AFTER SURGERY ADVIL, IBUPROFEN, ALEVE. MOTRIN, ANAPROX, GOODY POWDER             ____ Stop supplements until after surgery            Deje de tomar suplementos hasta despus de la ciruga  _X___ Bring C-Pap to the hospital          Lleve el C-Pap al hospital

## 2016-10-04 ENCOUNTER — Encounter
Admission: RE | Admit: 2016-10-04 | Discharge: 2016-10-04 | Disposition: A | Discharge status: Home / Self Care | Payer: Medicare Other | Source: Ambulatory Visit | Attending: Surgery | Admitting: Surgery

## 2016-10-04 DIAGNOSIS — Z01818 Encounter for other preprocedural examination: Secondary | ICD-10-CM | POA: Diagnosis not present

## 2016-10-04 LAB — URINALYSIS, COMPLETE (UACMP) WITH MICROSCOPIC
BILIRUBIN URINE: NEGATIVE
Glucose, UA: NEGATIVE mg/dL
HGB URINE DIPSTICK: NEGATIVE
Ketones, ur: NEGATIVE mg/dL
LEUKOCYTES UA: NEGATIVE
Nitrite: NEGATIVE
PROTEIN: NEGATIVE mg/dL
RBC / HPF: NONE SEEN RBC/hpf (ref 0–5)
Specific Gravity, Urine: 1.015 (ref 1.005–1.030)
pH: 5 (ref 5.0–8.0)

## 2016-10-05 LAB — URINE CULTURE

## 2016-10-09 ENCOUNTER — Inpatient Hospital Stay: Payer: Medicare Other | Admitting: Anesthesiology

## 2016-10-09 ENCOUNTER — Inpatient Hospital Stay: Payer: Medicare Other

## 2016-10-09 ENCOUNTER — Encounter
Admission: RE | Disposition: A | Discharge status: Skilled Nursing Facility | Payer: Self-pay | Source: Ambulatory Visit | Attending: Surgery

## 2016-10-09 ENCOUNTER — Encounter: Payer: Self-pay | Admitting: *Deleted

## 2016-10-09 ENCOUNTER — Inpatient Hospital Stay
Admission: RE | Admit: 2016-10-09 | Discharge: 2016-10-10 | DRG: 483 | Disposition: A | Discharge status: Skilled Nursing Facility | Payer: Medicare Other | Source: Ambulatory Visit | Attending: Surgery | Admitting: Surgery

## 2016-10-09 DIAGNOSIS — F419 Anxiety disorder, unspecified: Secondary | ICD-10-CM | POA: Diagnosis present

## 2016-10-09 DIAGNOSIS — F329 Major depressive disorder, single episode, unspecified: Secondary | ICD-10-CM | POA: Diagnosis present

## 2016-10-09 DIAGNOSIS — W010XXA Fall on same level from slipping, tripping and stumbling without subsequent striking against object, initial encounter: Secondary | ICD-10-CM | POA: Diagnosis present

## 2016-10-09 DIAGNOSIS — R7303 Prediabetes: Secondary | ICD-10-CM | POA: Diagnosis present

## 2016-10-09 DIAGNOSIS — I10 Essential (primary) hypertension: Secondary | ICD-10-CM | POA: Diagnosis present

## 2016-10-09 DIAGNOSIS — M25511 Pain in right shoulder: Secondary | ICD-10-CM | POA: Diagnosis present

## 2016-10-09 DIAGNOSIS — G20 Parkinson's disease: Secondary | ICD-10-CM | POA: Diagnosis present

## 2016-10-09 DIAGNOSIS — Z96611 Presence of right artificial shoulder joint: Secondary | ICD-10-CM

## 2016-10-09 DIAGNOSIS — Z87891 Personal history of nicotine dependence: Secondary | ICD-10-CM | POA: Diagnosis not present

## 2016-10-09 DIAGNOSIS — G4730 Sleep apnea, unspecified: Secondary | ICD-10-CM | POA: Diagnosis present

## 2016-10-09 DIAGNOSIS — K219 Gastro-esophageal reflux disease without esophagitis: Secondary | ICD-10-CM | POA: Diagnosis present

## 2016-10-09 DIAGNOSIS — S42291A Other displaced fracture of upper end of right humerus, initial encounter for closed fracture: Principal | ICD-10-CM | POA: Diagnosis present

## 2016-10-09 HISTORY — PX: REVERSE SHOULDER ARTHROPLASTY: SHX5054

## 2016-10-09 LAB — ABO/RH: ABO/RH(D): B NEG

## 2016-10-09 SURGERY — ARTHROPLASTY, SHOULDER, TOTAL, REVERSE
Anesthesia: Regional | Site: Shoulder | Laterality: Right | Wound class: Clean

## 2016-10-09 MED ORDER — TRANEXAMIC ACID 1000 MG/10ML IV SOLN
INTRAVENOUS | Status: AC
  Filled 2016-10-09: qty 10

## 2016-10-09 MED ORDER — MIDAZOLAM HCL 2 MG/2ML IJ SOLN
INTRAMUSCULAR | Status: AC
  Filled 2016-10-09: qty 2

## 2016-10-09 MED ORDER — LINACLOTIDE 145 MCG PO CAPS
145.0000 ug | ORAL_CAPSULE | Freq: Every day | ORAL | Status: DC
  Administered 2016-10-10: 145 ug via ORAL
  Filled 2016-10-09 (×2): qty 1

## 2016-10-09 MED ORDER — MAGNESIUM HYDROXIDE 400 MG/5ML PO SUSP
30.0000 mL | Freq: Every day | ORAL | Status: DC | PRN
  Administered 2016-10-10: 30 mL via ORAL
  Filled 2016-10-09: qty 30

## 2016-10-09 MED ORDER — FENTANYL CITRATE (PF) 100 MCG/2ML IJ SOLN
50.0000 ug | Freq: Once | INTRAMUSCULAR | Status: AC
  Administered 2016-10-09: 50 ug via INTRAVENOUS

## 2016-10-09 MED ORDER — TRANEXAMIC ACID 1000 MG/10ML IV SOLN
INTRAVENOUS | Status: DC | PRN
  Administered 2016-10-09: 1000 mg via INTRAVENOUS

## 2016-10-09 MED ORDER — DEXTROSE 5 % IV SOLN
INTRAVENOUS | Status: DC | PRN
  Administered 2016-10-09: 30 ug/min via INTRAVENOUS
  Administered 2016-10-09: 20 ug/min via INTRAVENOUS

## 2016-10-09 MED ORDER — PHENYLEPHRINE HCL 10 MG/ML IJ SOLN
INTRAMUSCULAR | Status: DC | PRN
  Administered 2016-10-09: 100 ug via INTRAVENOUS

## 2016-10-09 MED ORDER — BUPIVACAINE LIPOSOME 1.3 % IJ SUSP
INTRAMUSCULAR | Status: AC
  Filled 2016-10-09: qty 20

## 2016-10-09 MED ORDER — SUGAMMADEX SODIUM 200 MG/2ML IV SOLN
INTRAVENOUS | Status: DC | PRN
  Administered 2016-10-09: 100 mg via INTRAVENOUS

## 2016-10-09 MED ORDER — PROPOFOL 10 MG/ML IV BOLUS
INTRAVENOUS | Status: DC | PRN
  Administered 2016-10-09: 150 mg via INTRAVENOUS
  Administered 2016-10-09: 50 mg via INTRAVENOUS

## 2016-10-09 MED ORDER — CLINDAMYCIN PHOSPHATE 900 MG/50ML IV SOLN
900.0000 mg | Freq: Once | INTRAVENOUS | Status: AC
  Administered 2016-10-09: 900 mg via INTRAVENOUS

## 2016-10-09 MED ORDER — DIPHENHYDRAMINE HCL 12.5 MG/5ML PO ELIX: 12.5000 mg | ORAL_SOLUTION | ORAL | Status: DC | PRN

## 2016-10-09 MED ORDER — PAROXETINE HCL 20 MG PO TABS
40.0000 mg | ORAL_TABLET | Freq: Every day | ORAL | Status: DC
  Administered 2016-10-10: 40 mg via ORAL
  Filled 2016-10-09: qty 2

## 2016-10-09 MED ORDER — FENTANYL CITRATE (PF) 100 MCG/2ML IJ SOLN
INTRAMUSCULAR | Status: AC
  Administered 2016-10-09: 50 ug via INTRAVENOUS
  Filled 2016-10-09: qty 2

## 2016-10-09 MED ORDER — LACTATED RINGERS IV SOLN
INTRAVENOUS | Status: DC | PRN
  Administered 2016-10-09 (×2): via INTRAVENOUS

## 2016-10-09 MED ORDER — FLEET ENEMA 7-19 GM/118ML RE ENEM: 1.0000 | ENEMA | Freq: Once | RECTAL | Status: DC | PRN

## 2016-10-09 MED ORDER — ONDANSETRON HCL 4 MG/2ML IJ SOLN
INTRAMUSCULAR | Status: AC
  Filled 2016-10-09: qty 2

## 2016-10-09 MED ORDER — DEXAMETHASONE SODIUM PHOSPHATE 10 MG/ML IJ SOLN
INTRAMUSCULAR | Status: AC
  Filled 2016-10-09: qty 1

## 2016-10-09 MED ORDER — PHENYLEPHRINE HCL 10 MG/ML IJ SOLN
INTRAMUSCULAR | Status: AC
  Filled 2016-10-09: qty 1

## 2016-10-09 MED ORDER — PROPOFOL 10 MG/ML IV BOLUS
INTRAVENOUS | Status: AC
  Filled 2016-10-09: qty 20

## 2016-10-09 MED ORDER — SODIUM CHLORIDE 0.9 % IJ SOLN
INTRAMUSCULAR | Status: AC
  Filled 2016-10-09: qty 50

## 2016-10-09 MED ORDER — PANTOPRAZOLE SODIUM 40 MG PO TBEC
40.0000 mg | DELAYED_RELEASE_TABLET | Freq: Every day | ORAL | Status: DC
  Administered 2016-10-10: 40 mg via ORAL
  Filled 2016-10-09: qty 1

## 2016-10-09 MED ORDER — METOCLOPRAMIDE HCL 10 MG PO TABS: 5.0000 mg | ORAL_TABLET | Freq: Three times a day (TID) | ORAL | Status: DC | PRN

## 2016-10-09 MED ORDER — KETOROLAC TROMETHAMINE 15 MG/ML IJ SOLN
7.5000 mg | Freq: Four times a day (QID) | INTRAMUSCULAR | Status: DC
  Administered 2016-10-09 – 2016-10-10 (×3): 7.5 mg via INTRAVENOUS
  Filled 2016-10-09 (×3): qty 1

## 2016-10-09 MED ORDER — BISACODYL 10 MG RE SUPP: 10.0000 mg | Freq: Every day | RECTAL | Status: DC | PRN

## 2016-10-09 MED ORDER — LACTATED RINGERS IV SOLN: INTRAVENOUS | Status: DC

## 2016-10-09 MED ORDER — ACETAMINOPHEN 325 MG PO TABS
650.0000 mg | ORAL_TABLET | Freq: Four times a day (QID) | ORAL | Status: DC | PRN
Start: 2016-10-09 — End: 2016-10-10
  Administered 2016-10-10: 650 mg via ORAL
  Filled 2016-10-09: qty 2

## 2016-10-09 MED ORDER — LIDOCAINE HCL (PF) 1 % IJ SOLN
INTRAMUSCULAR | Status: DC | PRN
  Administered 2016-10-09: 5 mL via SUBCUTANEOUS

## 2016-10-09 MED ORDER — ALPRAZOLAM 0.5 MG PO TABS: 0.5000 mg | ORAL_TABLET | Freq: Three times a day (TID) | ORAL | Status: DC | PRN

## 2016-10-09 MED ORDER — NEOMYCIN-POLYMYXIN B GU 40-200000 IR SOLN
Status: DC | PRN
  Administered 2016-10-09: 16 mL

## 2016-10-09 MED ORDER — ONDANSETRON HCL 4 MG/2ML IJ SOLN: 4.0000 mg | Freq: Four times a day (QID) | INTRAMUSCULAR | Status: DC | PRN

## 2016-10-09 MED ORDER — PSYLLIUM 95 % PO PACK
1.0000 | PACK | Freq: Three times a day (TID) | ORAL | Status: DC | PRN
  Filled 2016-10-09: qty 1

## 2016-10-09 MED ORDER — LIDOCAINE HCL (PF) 2 % IJ SOLN
INTRAMUSCULAR | Status: AC
  Filled 2016-10-09: qty 2

## 2016-10-09 MED ORDER — CLINDAMYCIN PHOSPHATE 900 MG/50ML IV SOLN
900.0000 mg | Freq: Four times a day (QID) | INTRAVENOUS | Status: AC
  Administered 2016-10-09 – 2016-10-10 (×3): 900 mg via INTRAVENOUS
  Filled 2016-10-09 (×3): qty 50

## 2016-10-09 MED ORDER — LIDOCAINE HCL (CARDIAC) 20 MG/ML IV SOLN
INTRAVENOUS | Status: DC | PRN
  Administered 2016-10-09: 100 mg via INTRAVENOUS

## 2016-10-09 MED ORDER — FENTANYL CITRATE (PF) 100 MCG/2ML IJ SOLN
INTRAMUSCULAR | Status: DC | PRN
  Administered 2016-10-09: 100 ug via INTRAVENOUS
  Administered 2016-10-09 (×2): 50 ug via INTRAVENOUS

## 2016-10-09 MED ORDER — ONDANSETRON HCL 4 MG/2ML IJ SOLN: 4.0000 mg | Freq: Once | INTRAMUSCULAR | Status: DC | PRN

## 2016-10-09 MED ORDER — DEXAMETHASONE SODIUM PHOSPHATE 4 MG/ML IJ SOLN
INTRAMUSCULAR | Status: DC | PRN
  Administered 2016-10-09: 5 mg via INTRAVENOUS

## 2016-10-09 MED ORDER — ROCURONIUM BROMIDE 50 MG/5ML IV SOLN
INTRAVENOUS | Status: AC
  Filled 2016-10-09: qty 1

## 2016-10-09 MED ORDER — FENTANYL CITRATE (PF) 100 MCG/2ML IJ SOLN: 25.0000 ug | INTRAMUSCULAR | Status: DC | PRN

## 2016-10-09 MED ORDER — OXYCODONE HCL 5 MG PO TABS: 5.0000 mg | ORAL_TABLET | ORAL | Status: DC | PRN

## 2016-10-09 MED ORDER — ONDANSETRON HCL 4 MG/2ML IJ SOLN
INTRAMUSCULAR | Status: DC | PRN
  Administered 2016-10-09: 4 mg via INTRAVENOUS

## 2016-10-09 MED ORDER — CLINDAMYCIN PHOSPHATE 900 MG/50ML IV SOLN
INTRAVENOUS | Status: AC
  Filled 2016-10-09: qty 50

## 2016-10-09 MED ORDER — METOCLOPRAMIDE HCL 5 MG/ML IJ SOLN: 5.0000 mg | Freq: Three times a day (TID) | INTRAMUSCULAR | Status: DC | PRN

## 2016-10-09 MED ORDER — ROCURONIUM BROMIDE 100 MG/10ML IV SOLN
INTRAVENOUS | Status: DC | PRN
  Administered 2016-10-09: 10 mg via INTRAVENOUS
  Administered 2016-10-09: 50 mg via INTRAVENOUS

## 2016-10-09 MED ORDER — VITAMIN B-12 1000 MCG PO TABS: 1000.0000 ug | ORAL_TABLET | Freq: Every day | ORAL | Status: DC

## 2016-10-09 MED ORDER — POLYVINYL ALCOHOL 1.4 % OP SOLN
1.0000 [drp] | OPHTHALMIC | Status: DC | PRN
  Filled 2016-10-09: qty 15

## 2016-10-09 MED ORDER — VITAMIN D 1000 UNITS PO TABS
2000.0000 [IU] | ORAL_TABLET | Freq: Every day | ORAL | Status: DC
  Administered 2016-10-10: 2000 [IU] via ORAL
  Filled 2016-10-09: qty 2

## 2016-10-09 MED ORDER — IRBESARTAN 75 MG PO TABS
75.0000 mg | ORAL_TABLET | Freq: Every day | ORAL | Status: DC
  Administered 2016-10-10: 75 mg via ORAL
  Filled 2016-10-09: qty 1

## 2016-10-09 MED ORDER — BUPIVACAINE-EPINEPHRINE (PF) 0.5% -1:200000 IJ SOLN
INTRAMUSCULAR | Status: DC | PRN
Start: 2016-10-09 — End: 2016-10-09
  Administered 2016-10-09: 30 mL via PERINEURAL

## 2016-10-09 MED ORDER — ACETAMINOPHEN 650 MG RE SUPP: 650.0000 mg | Freq: Four times a day (QID) | RECTAL | Status: DC | PRN

## 2016-10-09 MED ORDER — PHENYLEPHRINE HCL 10 MG/ML IJ SOLN
INTRAMUSCULAR | Status: DC | PRN
  Administered 2016-10-09: 13:00:00 via TOPICAL

## 2016-10-09 MED ORDER — FIBER 7 PO POWD: Freq: Three times a day (TID) | ORAL | Status: DC

## 2016-10-09 MED ORDER — ONDANSETRON HCL 4 MG PO TABS: 4.0000 mg | ORAL_TABLET | Freq: Four times a day (QID) | ORAL | Status: DC | PRN

## 2016-10-09 MED ORDER — SUCCINYLCHOLINE CHLORIDE 20 MG/ML IJ SOLN
INTRAMUSCULAR | Status: DC | PRN
  Administered 2016-10-09: 100 mg via INTRAVENOUS

## 2016-10-09 MED ORDER — ACETAMINOPHEN 500 MG PO TABS
1000.0000 mg | ORAL_TABLET | Freq: Four times a day (QID) | ORAL | Status: DC
  Administered 2016-10-10 (×2): 1000 mg via ORAL
  Filled 2016-10-09 (×2): qty 2

## 2016-10-09 MED ORDER — METOPROLOL SUCCINATE ER 50 MG PO TB24
50.0000 mg | ORAL_TABLET | Freq: Every day | ORAL | Status: DC
  Administered 2016-10-10: 50 mg via ORAL
  Filled 2016-10-09: qty 1

## 2016-10-09 MED ORDER — BUPIVACAINE-EPINEPHRINE (PF) 0.5% -1:200000 IJ SOLN
INTRAMUSCULAR | Status: AC
  Filled 2016-10-09: qty 30

## 2016-10-09 MED ORDER — HYDROMORPHONE HCL 1 MG/ML IJ SOLN: 0.5000 mg | INTRAMUSCULAR | Status: DC | PRN

## 2016-10-09 MED ORDER — NEOMYCIN-POLYMYXIN B GU 40-200000 IR SOLN
Status: AC
  Filled 2016-10-09: qty 20

## 2016-10-09 MED ORDER — HYDROXYZINE HCL 25 MG PO TABS
25.0000 mg | ORAL_TABLET | Freq: Three times a day (TID) | ORAL | Status: DC
  Administered 2016-10-09 – 2016-10-10 (×2): 25 mg via ORAL
  Filled 2016-10-09 (×2): qty 1

## 2016-10-09 MED ORDER — KETOROLAC TROMETHAMINE 15 MG/ML IJ SOLN: 15.0000 mg | Freq: Once | INTRAMUSCULAR | Status: DC

## 2016-10-09 MED ORDER — KCL IN DEXTROSE-NACL 20-5-0.9 MEQ/L-%-% IV SOLN
INTRAVENOUS | Status: DC
  Administered 2016-10-09: 19:00:00 via INTRAVENOUS
  Filled 2016-10-09 (×4): qty 1000

## 2016-10-09 MED ORDER — EPHEDRINE SULFATE 50 MG/ML IJ SOLN
INTRAMUSCULAR | Status: DC | PRN
  Administered 2016-10-09: 10 mg via INTRAVENOUS
  Administered 2016-10-09: 15 mg via INTRAVENOUS
  Administered 2016-10-09: 10 mg via INTRAVENOUS

## 2016-10-09 MED ORDER — FENTANYL CITRATE (PF) 250 MCG/5ML IJ SOLN
INTRAMUSCULAR | Status: AC
  Filled 2016-10-09: qty 5

## 2016-10-09 MED ORDER — SUGAMMADEX SODIUM 200 MG/2ML IV SOLN
INTRAVENOUS | Status: AC
  Filled 2016-10-09: qty 2

## 2016-10-09 MED ORDER — ROPIVACAINE HCL 5 MG/ML IJ SOLN
INTRAMUSCULAR | Status: DC | PRN
  Administered 2016-10-09: 30 mL via PERINEURAL

## 2016-10-09 MED ORDER — PHENYLEPHRINE HCL 10 MG PO TABS
10.0000 mg | ORAL_TABLET | Freq: Four times a day (QID) | ORAL | Status: DC | PRN
  Filled 2016-10-09: qty 1

## 2016-10-09 MED ORDER — DOCUSATE SODIUM 100 MG PO CAPS
100.0000 mg | ORAL_CAPSULE | Freq: Two times a day (BID) | ORAL | Status: DC
  Administered 2016-10-09 – 2016-10-10 (×2): 100 mg via ORAL
  Filled 2016-10-09 (×2): qty 1

## 2016-10-09 MED ORDER — LIDOCAINE HCL (PF) 1 % IJ SOLN
INTRAMUSCULAR | Status: AC
  Filled 2016-10-09: qty 5

## 2016-10-09 MED ORDER — SODIUM CHLORIDE 0.9 % IV SOLN
INTRAVENOUS | Status: DC | PRN
  Administered 2016-10-09: 60 mL

## 2016-10-09 MED ORDER — ROPIVACAINE HCL 5 MG/ML IJ SOLN
INTRAMUSCULAR | Status: AC
  Filled 2016-10-09: qty 30

## 2016-10-09 MED ORDER — ENOXAPARIN SODIUM 40 MG/0.4ML ~~LOC~~ SOLN
40.0000 mg | SUBCUTANEOUS | Status: DC
  Administered 2016-10-10: 40 mg via SUBCUTANEOUS
  Filled 2016-10-09: qty 0.4

## 2016-10-09 SURGICAL SUPPLY — 70 items
BAG DECANTER FOR FLEXI CONT (MISCELLANEOUS) ×3 IMPLANT
BIT DRILL TWIST 2.7 (BIT) ×2 IMPLANT
BIT DRILL TWIST 2.7MM (BIT) ×1
BLADE SAGITTAL WIDE XTHICK NO (BLADE) ×3 IMPLANT
BNDG COHESIVE 4X5 TAN STRL (GAUZE/BANDAGES/DRESSINGS) ×3 IMPLANT
BONE CEMENT PALACOSE (Cement) ×3 IMPLANT
CANISTER SUCT 1200ML W/VALVE (MISCELLANEOUS) ×3 IMPLANT
CANISTER SUCT 3000ML PPV (MISCELLANEOUS) ×6 IMPLANT
CAPT SHLDR REVTOTAL 2 ×3 IMPLANT
CATH TRAY METER 16FR LF (MISCELLANEOUS) ×3 IMPLANT
CEMENT BONE PALACOSE (Cement) ×1 IMPLANT
CHLORAPREP W/TINT 26ML (MISCELLANEOUS) ×3 IMPLANT
COOLER POLAR GLACIER W/PUMP (MISCELLANEOUS) ×3 IMPLANT
CRADLE LAMINECT ARM (MISCELLANEOUS) ×3 IMPLANT
DECANTER SPIKE VIAL GLASS SM (MISCELLANEOUS) ×9 IMPLANT
DRAPE IMP U-DRAPE 54X76 (DRAPES) ×6 IMPLANT
DRAPE INCISE IOBAN 66X45 STRL (DRAPES) ×6 IMPLANT
DRAPE INCISE IOBAN 66X60 STRL (DRAPES) ×3 IMPLANT
DRAPE SHEET LG 3/4 BI-LAMINATE (DRAPES) ×6 IMPLANT
DRAPE TABLE BACK 80X90 (DRAPES) ×3 IMPLANT
DRSG OPSITE POSTOP 4X8 (GAUZE/BANDAGES/DRESSINGS) ×3 IMPLANT
ELECT CAUTERY BLADE 6.4 (BLADE) ×3 IMPLANT
GAUZE PACK 2X3YD (MISCELLANEOUS) ×3 IMPLANT
GLOVE BIO SURGEON STRL SZ7.5 (GLOVE) ×18 IMPLANT
GLOVE BIO SURGEON STRL SZ8 (GLOVE) ×12 IMPLANT
GLOVE BIOGEL PI IND STRL 8 (GLOVE) ×3 IMPLANT
GLOVE BIOGEL PI INDICATOR 8 (GLOVE) ×6
GLOVE INDICATOR 8.0 STRL GRN (GLOVE) ×3 IMPLANT
GOWN STRL REUS W/ TWL LRG LVL3 (GOWN DISPOSABLE) ×2 IMPLANT
GOWN STRL REUS W/ TWL XL LVL3 (GOWN DISPOSABLE) ×1 IMPLANT
GOWN STRL REUS W/TWL LRG LVL3 (GOWN DISPOSABLE) ×4
GOWN STRL REUS W/TWL XL LVL3 (GOWN DISPOSABLE) ×2
HOOD PEEL AWAY FLYTE STAYCOOL (MISCELLANEOUS) ×9 IMPLANT
KIT RM TURNOVER STRD PROC AR (KITS) ×3 IMPLANT
KIT STABILIZATION SHOULDER (MISCELLANEOUS) ×3 IMPLANT
MASK FACE SPIDER DISP (MASK) ×3 IMPLANT
NDL MAYO CATGUT SZ1 (NEEDLE) ×3
NDL MAYO CATGUT SZ5 (NEEDLE)
NDL SAFETY 22GX1.5 (NEEDLE) ×3 IMPLANT
NDL SUT 5 .5 CRC TPR PNT MAYO (NEEDLE) IMPLANT
NEEDLE 18GX1X1/2 (RX/OR ONLY) (NEEDLE) ×3 IMPLANT
NEEDLE HYPO 25X1 1.5 SAFETY (NEEDLE) ×3 IMPLANT
NEEDLE MAYO CATGUT SZ1 (NEEDLE) ×1 IMPLANT
NEEDLE MAYO CATGUT SZ4 (NEEDLE) IMPLANT
NEEDLE SPNL 20GX3.5 QUINCKE YW (NEEDLE) ×3 IMPLANT
NS IRRIG 1000ML POUR BTL (IV SOLUTION) ×3 IMPLANT
PACK ARTHROSCOPY SHOULDER (MISCELLANEOUS) ×3 IMPLANT
PAD WRAPON POLAR SHDR UNIV (MISCELLANEOUS) ×1 IMPLANT
PIN THREADED REVERSE (PIN) ×3 IMPLANT
PULSAVAC PLUS IRRIG FAN TIP (DISPOSABLE) ×3
SLING ULTRA II M (MISCELLANEOUS) ×3 IMPLANT
SOL .9 NS 3000ML IRR  AL (IV SOLUTION) ×2
SOL .9 NS 3000ML IRR UROMATIC (IV SOLUTION) ×1 IMPLANT
SPONGE LAP 18X18 5 PK (GAUZE/BANDAGES/DRESSINGS) IMPLANT
STAPLER SKIN PROX 35W (STAPLE) ×3 IMPLANT
SUT ETHIBOND 0 MO6 C/R (SUTURE) ×3 IMPLANT
SUT ETHIBOND NAB CT1 #1 30IN (SUTURE) IMPLANT
SUT FIBERWIRE #2 38 BLUE 1/2 (SUTURE) ×9
SUT VIC AB 0 CT1 36 (SUTURE) ×6 IMPLANT
SUT VIC AB 2-0 CT1 27 (SUTURE) ×4
SUT VIC AB 2-0 CT1 TAPERPNT 27 (SUTURE) ×2 IMPLANT
SUTURE FIBERWR #2 38 BLUE 1/2 (SUTURE) ×3 IMPLANT
SYR 30ML LL (SYRINGE) ×6 IMPLANT
SYRINGE 10CC LL (SYRINGE) ×3 IMPLANT
SYSTEM VACUUM CEMENT MIXING (MISCELLANEOUS) ×3 IMPLANT
TIP FAN IRRIG PULSAVAC PLUS (DISPOSABLE) ×1 IMPLANT
TUBE CONNECTING  6'X3/16 (MISCELLANEOUS) ×1
TUBE CONNECTING 6X3/16 (MISCELLANEOUS) ×2 IMPLANT
WATER STERILE IRR 1000ML POUR (IV SOLUTION) ×6 IMPLANT
WRAPON POLAR PAD SHDR UNIV (MISCELLANEOUS) ×3

## 2016-10-09 NOTE — Anesthesia Procedure Notes (Signed)
Procedure Name: Intubation Date/Time: 10/09/2016 10:40 AM Performed by: Rosaria Ferries, Kentaro Alewine Pre-anesthesia Checklist: Patient identified, Emergency Drugs available, Suction available and Patient being monitored Patient Re-evaluated:Patient Re-evaluated prior to inductionOxygen Delivery Method: Circle system utilized Preoxygenation: Pre-oxygenation with 100% oxygen Intubation Type: IV induction Laryngoscope Size: Mac and 3 Tube size: 7.0 mm Number of attempts: 2 Airway Equipment and Method: Bougie stylet Placement Confirmation: positive ETCO2 and breath sounds checked- equal and bilateral Secured at: 21 cm Tube secured with: Tape Dental Injury: Teeth and Oropharynx as per pre-operative assessment

## 2016-10-09 NOTE — Op Note (Signed)
10/09/2016  1:39 PM  Patient:   Lynn [place]  Pre-Op Diagnosis:   Closed fracture-dislocation, right shoulder.  Post-Op Diagnosis:   Same.  Procedure:   Reverse right total shoulder arthroplasty.  Surgeon:   Maryagnes Amos, MD  Assistant:   Horris Latino, PA-C  Anesthesia:   General endotracheal with an interscalene block placed preoperatively by the anesthesiologist.  Findings:   As above.  Complications:   None  EBL:  150 cc  Fluids:   900 cc crystalloid  UOP:  125 cc  TT:   None  Drains:   None  Closure:   Staples  Implants:   Hybrid Biomet Comprehensive system with a #6 cemented humeral fracture stem, a 9 mm centralizing cuff, a 44 mm humeral tray with a standard insert, and a mini-base plate with a 36 mm glenosphere.  Brief Clinical Note:   The patient is a 71 year old female with a history of Parkinson's disease who lost her balance and fell, injuring her right shoulder 10 days ago. She was seen in emergency room where x-rays demonstrated a fracture dislocation of her right shoulder. The patient underwent closed reduction of the shoulder under general anesthesia. Initial postreduction films showed satisfactory reduction of the glenohumeral joint and the proximal humerus. However, upon return to the office last week, subsequent x-rays demonstrated displacement of the fracture fragments with a clear head splitting component. The patient presents at this time for a reverse right total shoulder arthroplasty.  Procedure:   The patient underwent placement of an interscalene block by the anesthesiologist in the preoperative holding area before being brought into the operating room and lain in the supine position. The patient then underwent general endotracheal intubation and anesthesia before a Foley catheter was inserted and the patient repositioned in the beach chair position using the beach chair positioner. The right shoulder and upper extremity were prepped with ChloraPrep  solution before being draped sterilely. Preoperative antibiotics were administered. A standard anterior approach to the shoulder was made through an approximately 5-6 inch incision. The incision was carried down through the subcutaneous tissues to expose the deltopectoral fascia. The interval between the deltoid and pectoralis muscles was identified and this plane developed, retracting the cephalic vein laterally with the deltoid muscle. The conjoined tendon was identified. Its lateral margin was dissected and the Kolbel self-retraining retractor inserted. The "three sisters" were identified and cauterized. Bursal tissues were removed to improve visualization. The biceps tendon was identified and dissected up through the groove and into the joint, splitting the rotator cuff through the rotator interval. The lesser tuberosity fragment was dissected free, leaving the subscapularis tendon intact. This was retracted medially. Several #0 Ethibond interrupted tagging sutures were placed in the subscapularis tendon to help control this fragment. Several additional #0 Ethibond interrupted sutures were placed through the supraspinatus and infraspinatus tendons to help control the greater tuberosity fragment. Humeral head fragments were removed as were some fracture hematoma and additional bursal tissues to help improve visualization.   Attention was redirected to the glenoid. The labrum was debrided circumferentially before the center of the glenoid was marked with electrocautery. The guidewire was drilled into the glenoid neck using the appropriate guide. After verifying its position, it was overreamed with the mini-baseplate reamer to create a flat surface. The permanent mini-baseplate was impacted into place. It was stabilized with a 30 x 6.5 mm central screw and four peripheral screws. Locking screws were placed superiorly and inferiorly while nonlocking screws were placed anteriorly and posteriorly.  The permanent 36  mm glenosphere was then impacted into place and its Morse taper locking mechanism verified using manual distraction.  Attention was directed to the humeral side. The humeral canal was reamed sequentially beginning with the end-cutting reamer then progressing from a 4 mm reamer up to a 10 mm reamer. The depth of reaming was assessed based on the medial calcar height which was intact. The 10 mm reamer provided excellent circumferential chatter. An attempt was made to tap the canal with a 10 mm tap but it proved to be too tight to get it down into the canal enough, so the 9 mm tap was used and was able to be seated to the appropriate depth. The permanent 9 mm centralizing cuff was inserted and positioned at the appropriate depth.  The humeral canal was prepared for cementing by irrigating and thoroughly with bacitracin saline solution via the jet lavage system and packing it with a Neo-Synephrine soaked vaginal pack. Meanwhile, cement was mixed on the back table. When the cement was ready, the cement was inserted into the proximal portion of the humeral metaphysis and the 6 mm fracture stem introduced and advanced securely with care taken to be sure the appropriate retroversion was maintained. Of note, two #2 FiberWire sutures were placed through cortical drill holes into the lateral aspect of the humeral shaft prior to cementing the stem.  Once the cement hardened, a trial reduction performed using the standard trial humeral platform. The arm demonstrated excellent range of motion as the hand could be brought across the chest to the opposite shoulder and brought to the top of the patient's head and to the patient's ear. The shoulder appeared stable throughout this range of motion. The joint was dislocated and the trial components removed. The permanent 44 mm humeral platform with the standard insert was put together on the back table and impacted into place. Again, the [facility] taper locking mechanism was verified  using manual distraction. The shoulder was relocated using two finger pressure and again placed through a range of motion with the findings as described above.  The wound was copiously irrigated with bacitracin saline solution using the jet lavage system before a total of 20 cc of Exparel diluted out to 60 cc with normal saline and 30 cc of 0.5% Sensorcaine with epinephrine was injected into the pericapsular and peri-incisional tissues to help with postoperative analgesia. The greater and lesser tuberosity fragments were reapproximated and secured using several #2 FiberWire interrupted sutures which also were passed through the holes in the fin of the humeral stem. The previously placed sutures were then woven up over the top of the tuberosities and tied laterally to further reinforce the fixation of the tuberosities.   Once again, the wound was copiously irrigated with bacitracin saline solution using the jet lavage system before the deltopectoral interval was closed using #0 Vicryl interrupted sutures before the subcutaneous tissues were closed using 2-0 Vicryl interrupted sutures. The skin was closed using staples. Prior to closing the skin, 1 g of transexemic acid in 10 cc of normal saline was injected intra-articularly to help with postoperative bleeding. A sterile occlusive dressing was applied to the wound before the arm was placed into a shoulder immobilizer with an abduction pillow. A Polar Care system also was applied to the shoulder. The patient was then transferred back to a hospital bed before being awakened, extubated, and returned to the recovery room in satisfactory condition after tolerating the procedure well.

## 2016-10-09 NOTE — Transfer of Care (Signed)
Immediate Anesthesia Transfer of Care Note  Patient: [place]  Procedure(s) Performed: Procedure(s): REVERSE SHOULDER ARTHROPLASTY (Right)  Patient Location: PACU  Anesthesia Type:General  Level of Consciousness: awake, alert  and oriented  Airway & Oxygen Therapy: Patient Spontanous Breathing and Patient connected to face mask oxygen  Post-op Assessment: Report given to RN and Post -op Vital signs reviewed and stable  Post vital signs: Reviewed and stable  Last Vitals:  Vitals:   10/09/16 1032 10/09/16 1359  BP:  136/67  Pulse: 63 73  Resp: 15 16  Temp:  36.7 C    Last Pain:  Vitals:   10/09/16 1027  TempSrc:   PainSc: 0-No pain         Complications: No apparent anesthesia complications

## 2016-10-09 NOTE — H&P (Signed)
Paper H&P to be scanned into permanent record. H&P reviewed and patient re-examined. No changes. 

## 2016-10-09 NOTE — Anesthesia Procedure Notes (Signed)
Anesthesia Regional Block: Interscalene brachial plexus block   Pre-Anesthetic Checklist: ,, timeout performed, Correct Patient, Correct Site, Correct Laterality, Correct Procedure, Correct Position, site marked, Risks and benefits discussed,  Surgical consent,  Pre-op evaluation,  At surgeon's request and post-op pain management  Laterality: Right and Upper  Prep: chloraprep       Needles:  Injection technique: Single-shot  Needle Type: Stimiplex     Needle Length: 10cm  Needle Gauge: 22     Additional Needles:   Procedures: ultrasound guided,,,,,,,,  Narrative:  Start time: 10/09/2016 10:19 AM End time: 10/09/2016 10:26 AM Injection made incrementally with aspirations every 5 mL.  Performed by: Personally  Anesthesiologist: Lenard SimmerKARENZ, Zoanne Newill  Additional Notes: Functioning IV was confirmed and monitors were applied.  A 50mm 22ga Stimuplex needle was used. Sterile prep and drape,hand hygiene and sterile gloves were used.  Negative aspiration and negative test dose prior to incremental administration of local anesthetic. The patient tolerated the procedure well.

## 2016-10-09 NOTE — [place] FL2 (Signed)
[place] MEDICAID FL2 LEVEL OF CARE SCREENING TOOL     IDENTIFICATION  Patient Name: [place] Birthdate: Oct 17, 1945 Sex: female Admission Date (Current Location): 10/09/2016  [place] and [place] Number:  Chiropodist and Address:  [facility], [address] [place], [place] [address]      Provider Number: [id number]  Attending Physician Name and Address:  Christena Flake, MD  Relative Name and Phone Number:       Current Level of Care: Hospital Recommended Level of Care: Skilled Nursing Facility Prior Approval Number:    Date Approved/Denied:   PASRR Number:  [id number])  Discharge Plan: SNF    Current Diagnoses: Patient Active Problem List   Diagnosis Date Noted  . 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 Internal hemorrhoids 03/01/2016  . Acute anxiety 11/22/2014  . 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 Depression 11/22/2014  . Sore throat 11/16/2014    Orientation RESPIRATION BLADDER Height & Weight     Self, Time, Place, Situation  O2 (2 Liters Oxygen ) Continent Weight: 200 lb (90.7 kg) Height:  5\' 8"  (172.7 cm)  BEHAVIORAL SYMPTOMS/MOOD NEUROLOGICAL BOWEL NUTRITION STATUS   (none)  (none) Continent Diet (Diet: Clear Liquid to be advanced )  AMBULATORY STATUS COMMUNICATION OF NEEDS Skin   Extensive Assist Verbally Surgical wounds (Incision: Right Shoulder )                       Personal Care Assistance Level of Assistance  Bathing, Feeding, Dressing Bathing Assistance: Limited assistance Feeding assistance: Independent Dressing Assistance: Limited assistance     Functional Limitations Info  Sight, Hearing, Speech Sight Info: Adequate Hearing Info: Adequate Speech Info: Adequate    SPECIAL CARE FACTORS FREQUENCY  PT (By licensed PT), OT (By licensed OT)     PT Frequency:  (5) OT Frequency:  (5)            Contractures      Additional Factors Info  Code Status,  Allergies Code Status Info:  (Full Code. ) Allergies Info:  (Erythromycin, Penicillins)           Current Medications (10/09/2016):  This is the current hospital active medication list Current Facility-Administered Medications  Medication Dose Route Frequency Provider Last Rate Last Dose  . acetaminophen (TYLENOL) tablet 650 mg  650 mg Oral Q6H PRN Poggi, Excell Seltzer, MD       Or  . acetaminophen (TYLENOL) suppository 650 mg  650 mg Rectal Q6H PRN Poggi, Excell Seltzer, MD      . acetaminophen (TYLENOL) tablet 1,000 mg  1,000 mg Oral Q6H Poggi, Excell Seltzer, MD      . ALPRAZolam Prudy Feeler) tablet 0.5 mg  0.5 mg Oral TID PRN Poggi, Excell Seltzer, MD      . bisacodyl (DULCOLAX) suppository 10 mg  10 mg Rectal Daily PRN Poggi, Excell Seltzer, MD      . cholecalciferol (VITAMIN D) tablet 2,000 Units  2,000 Units Oral Daily Poggi, Excell Seltzer, MD      . clindamycin (CLEOCIN) IVPB 900 mg  900 mg Intravenous Q6H Poggi, John J, MD      . dextrose 5 % and 0.9 % NaCl with KCl 20 mEq/L infusion   Intravenous Continuous Poggi, Excell Seltzer, MD      . diphenhydrAMINE (BENADRYL) 12.5 MG/5ML elixir 12.5-25 mg  12.5-25 mg Oral Q4H PRN Poggi, Excell Seltzer, MD      . docusate sodium (  COLACE) capsule 100 mg  100 mg Oral BID Poggi, Excell SeltzerJohn J, MD      . Melene Muller[START ON 10/10/2016] enoxaparin (LOVENOX) injection 40 mg  40 mg Subcutaneous Q24H Poggi, Excell SeltzerJohn J, MD      . HYDROmorphone (DILAUDID) injection 0.5-1 mg  0.5-1 mg Intravenous Q2H PRN Poggi, Excell SeltzerJohn J, MD      . hydrOXYzine (ATARAX/VISTARIL) tablet 25 mg  25 mg Oral TID Christena FlakePoggi, John J, MD      . Melene Muller[START ON 10/10/2016] irbesartan (AVAPRO) tablet 75 mg  75 mg Oral Daily Poggi, Excell SeltzerJohn J, MD      . ketorolac (TORADOL) 15 MG/ML injection 7.5 mg  7.5 mg Intravenous Q6H Poggi, Excell SeltzerJohn J, MD      . linaclotide (LINZESS) capsule 145 mcg  145 mcg Oral QAC breakfast Poggi, Excell SeltzerJohn J, MD      . magnesium hydroxide (MILK OF MAGNESIA) suspension 30 mL  30 mL Oral Daily PRN Poggi, Excell SeltzerJohn J, MD      . metoCLOPramide (REGLAN) tablet 5-10 mg  5-10 mg  Oral Q8H PRN Poggi, Excell SeltzerJohn J, MD       Or  . metoCLOPramide (REGLAN) injection 5-10 mg  5-10 mg Intravenous Q8H PRN Poggi, Excell SeltzerJohn J, MD      . Melene Muller[START ON 10/10/2016] metoprolol succinate (TOPROL-XL) 24 hr tablet 50 mg  50 mg Oral Daily Poggi, Excell SeltzerJohn J, MD      . ondansetron (ZOFRAN) tablet 4 mg  4 mg Oral Q6H PRN Poggi, Excell SeltzerJohn J, MD       Or  . ondansetron (ZOFRAN) injection 4 mg  4 mg Intravenous Q6H PRN Poggi, Excell SeltzerJohn J, MD      . oxyCODONE (Oxy IR/ROXICODONE) immediate release tablet 5-10 mg  5-10 mg Oral Q3H PRN Poggi, Excell SeltzerJohn J, MD      . pantoprazole (PROTONIX) EC tablet 40 mg  40 mg Oral QAC breakfast Poggi, Excell SeltzerJohn J, MD      . Melene Muller[START ON 10/10/2016] PARoxetine (PAXIL) tablet 40 mg  40 mg Oral Daily Poggi, Excell SeltzerJohn J, MD      . phenylephrine (SUDAFED PE) tablet 10 mg  10 mg Oral Q6H PRN Poggi, Excell SeltzerJohn J, MD      . polyvinyl alcohol (LIQUIFILM TEARS) 1.4 % ophthalmic solution 1 drop  1 drop Both Eyes PRN Poggi, Excell SeltzerJohn J, MD      . psyllium (HYDROCIL/METAMUCIL) packet 1 packet  1 packet Oral TID PRN Poggi, Excell SeltzerJohn J, MD      . sodium phosphate (FLEET) 7-19 GM/118ML enema 1 enema  1 enema Rectal Once PRN Poggi, Excell SeltzerJohn J, MD      . vitamin B-12 (CYANOCOBALAMIN) tablet 1,000 mcg  1,000 mcg Oral Daily Poggi, Excell SeltzerJohn J, MD         Discharge Medications: Please see discharge summary for a list of discharge medications.  Relevant Imaging Results:  Relevant Lab Results:   Additional Information  (SSN: [id number])  Anastacia Reinecke, Darleen CrockerBailey M, LCSW

## 2016-10-09 NOTE — Anesthesia Preprocedure Evaluation (Addendum)
Anesthesia Evaluation  Patient identified by MRN, date of birth, ID band Patient awake    Reviewed: Allergy & Precautions, H&P , NPO status , Patient's Chart, lab work & pertinent test results, reviewed documented beta blocker date and time   History of Anesthesia Complications (+) PONV and history of anesthetic complications  Airway Mallampati: IV  TM Distance: >3 FB Neck ROM: full    Dental no notable dental hx. (+) Caps, Poor Dentition   Pulmonary neg shortness of breath, sleep apnea and Continuous Positive Airway Pressure Ventilation , neg COPD, neg recent URI, former smoker,           Cardiovascular Exercise Tolerance: Good hypertension, (-) angina(-) CAD, (-) Past MI, (-) Cardiac Stents and (-) CABG (-) dysrhythmias + Valvular Problems/Murmurs (as a child)      Neuro/Psych neg Seizures PSYCHIATRIC DISORDERS (Depression)  Neuromuscular disease (Parkinson's disease)    GI/Hepatic negative GI ROS, Neg liver ROS,   Endo/Other  diabetes  Renal/GU negative Renal ROS  negative genitourinary   Musculoskeletal   Abdominal   Peds  Hematology negative hematology ROS (+)   Anesthesia Other Findings Past Medical History: No date: Anxiety No date: Depression No date: Diabetes mellitus without complication (HCC) No date: Hemorrhoids No date: Hypertension No date: Neuromuscular disorder (HCC) 2014: Parkinson disease (HCC) No date: Sleep apnea     Comment: Uses a C-Pap machine No date: Vertigo   Reproductive/Obstetrics negative OB ROS                             Anesthesia Physical  Anesthesia Plan  ASA: III  Anesthesia Plan: Regional and General ETT   Post-op Pain Management:    Induction:   Airway Management Planned:   Additional Equipment:   Intra-op Plan:   Post-operative Plan:   Informed Consent: I have reviewed the patients History and Physical, chart, labs and discussed  the procedure including the risks, benefits and alternatives for the proposed anesthesia with the patient or authorized representative who has indicated his/her understanding and acceptance.   Dental Advisory Given  Plan Discussed with: Anesthesiologist, CRNA and Surgeon  Anesthesia Plan Comments:        Anesthesia Quick Evaluation

## 2016-10-09 NOTE — Anesthesia Post-op Follow-up Note (Cosign Needed)
Anesthesia QCDR form completed.        

## 2016-10-10 ENCOUNTER — Encounter: Payer: Self-pay | Admitting: Surgery

## 2016-10-10 LAB — CBC WITH DIFFERENTIAL/PLATELET
BASOS PCT: 0 %
Basophils Absolute: 0 10*3/uL (ref 0–0.1)
Eosinophils Absolute: 0 10*3/uL (ref 0–0.7)
Eosinophils Relative: 0 %
HEMATOCRIT: 28.7 % — AB (ref 35.0–47.0)
HEMOGLOBIN: 9.9 g/dL — AB (ref 12.0–16.0)
Lymphocytes Relative: 10 %
Lymphs Abs: 1 10*3/uL (ref 1.0–3.6)
MCH: 31 pg (ref 26.0–34.0)
MCHC: 34.5 g/dL (ref 32.0–36.0)
MCV: 89.9 fL (ref 80.0–100.0)
Monocytes Absolute: 0.7 10*3/uL (ref 0.2–0.9)
Monocytes Relative: 7 %
NEUTROS ABS: 7.7 10*3/uL — AB (ref 1.4–6.5)
NEUTROS PCT: 83 %
Platelets: 277 10*3/uL (ref 150–440)
RBC: 3.19 MIL/uL — ABNORMAL LOW (ref 3.80–5.20)
RDW: 13.6 % (ref 11.5–14.5)
WBC: 9.4 10*3/uL (ref 3.6–11.0)

## 2016-10-10 LAB — BASIC METABOLIC PANEL
ANION GAP: 5 (ref 5–15)
BUN: 16 mg/dL (ref 6–20)
CHLORIDE: 104 mmol/L (ref 101–111)
CO2: 29 mmol/L (ref 22–32)
CREATININE: 0.87 mg/dL (ref 0.44–1.00)
Calcium: 9.1 mg/dL (ref 8.9–10.3)
GFR calc non Af Amer: >60 mL/min (ref >60)
Glucose, Bld: 147 mg/dL — ABNORMAL HIGH (ref 65–99)
POTASSIUM: 4.6 mmol/L (ref 3.5–5.1)
Sodium: 138 mmol/L (ref 135–145)

## 2016-10-10 MED ORDER — OXYCODONE HCL 5 MG PO TABS: 5.0000 mg | ORAL_TABLET | ORAL | 0 refills | Status: AC | PRN

## 2016-10-10 NOTE — Progress Notes (Signed)
Subjective: 1 Day Post-Op Procedure(s) (LRB): REVERSE SHOULDER ARTHROPLASTY (Right) Patient reports pain as mild.  Still some numbness in the patient's arm from the block. Patient is well but tremors present due to parkinsons. Plan is to go Home after hospital stay. Negative for chest pain and shortness of breath Fever: Mild fevers last night, 99.2 Gastrointestinal:Negative for nausea and vomiting  Objective: Vital signs in last 24 hours: Temp:  [97.4 F (36.3 C)-99.2 F (37.3 C)] 98.3 F (36.8 C) (05/16 0456) Pulse Rate:  [62-79] 79 (05/16 0456) Resp:  [13-22] 19 (05/16 0456) BP: (110-147)/(55-80) 130/60 (05/16 0456) SpO2:  [96 %-100 %] 100 % (05/16 0456) Weight:  [90.7 kg (200 lb)] 90.7 kg (200 lb) (05/15 0900)  Intake/Output from previous day:  Intake/Output Summary (Last 24 hours) at 10/10/16 0737 Last data filed at 10/10/16 0602  Gross per 24 hour  Intake          2901.25 ml  Output             1115 ml  Net          1786.25 ml    Intake/Output this shift: No intake/output data recorded.  Labs:  Recent Labs  10/10/16 0437  HGB 9.9*    Recent Labs  10/10/16 0437  WBC 9.4  RBC 3.19*  HCT 28.7*  PLT 277    Recent Labs  10/10/16 0437  NA 138  K 4.6  CL 104  CO2 29  BUN 16  CREATININE 0.87  GLUCOSE 147*  CALCIUM 9.1   No results for input(s): LABPT, INR in the last 72 hours.   EXAM General - Patient is Alert and forgetful Extremity - ABD soft Dorsiflexion/Plantar flexion intact Incision: dressing C/D/I  Intact to light touch, but does have decreased sensation to the right upper extremity and continues to have numbness in her hand.  Able to flex and extend fingers without difficulty. Dressing/Incision - clean, dry, no drainage Motor Function - intact, moving foot and toes well on exam.  Abdomen soft with normal BS.  Pt states that she is passing gas without difficulty.   Past Medical History:  Diagnosis Date  . Anemia    hx in her 30's   . Anxiety   . Depression   . GERD (gastroesophageal reflux disease)   . Hemorrhoids   . Hypertension   . Neuromuscular disorder (HCC)   . Parkinson disease (HCC) 2014  . PONV (postoperative nausea and vomiting)   . Pre-diabetes   . Sleep apnea    Uses a C-Pap machine  . Vertigo     Assessment/Plan: 1 Day Post-Op Procedure(s) (LRB): REVERSE SHOULDER ARTHROPLASTY (Right) Active Problems:   Status post reverse total shoulder replacement, right  Estimated body mass index is 30.41 kg/m as calculated from the following:   Height as of this encounter: 5\' 8"  (1.727 m).   Weight as of this encounter: 90.7 kg (200 lb). Advance diet Up with therapy D/C IV fluids when tolerating a po diet.  Labs reviewed, WBC 9.4, Hg 9.9. Foley removed, will need to urinate prior to discharge. Up with therapy today. Plan will be for discharge home today with HHPT pending PT and urinating without the foley cath.  DVT Prophylaxis - Lovenox, Foot Pumps and TED hose Non-weightbearing to the right arm.  Valeria BatmanJ. Lance Airen Dales, PA-C [facility] 10/10/2016, 7:37 AM

## 2016-10-10 NOTE — Clinical Social Work Note (Signed)
Clinical Social Work Assessment  Patient Details  Name: Lynn Bauer MRN: [id number] Date of Birth: Jul 16, 1945  Date of referral:  10/10/16               Reason for consult:  Facility Placement                Permission sought to share information with:  Chartered certified accountant granted to share information::  Yes, Verbal Permission Granted  Name::      [place]::   [place]   Relationship::     Contact Information:     Housing/Transportation Living arrangements for the past 2 months:  [place] of Information:  Patient, Spouse Patient Interpreter Needed:  None Criminal Activity/Legal Involvement Pertinent to Current Situation/Hospitalization:  No - Comment as needed Significant Relationships:  Adult Children, Spouse Lives with:  Spouse Do you feel safe going back to the place where you live?  Yes Need for family participation in patient care:  Yes (Comment)  Care giving concerns:  Patient lives in [place] with her husband [place].    Social Worker assessment / plan:  Holiday representative (CSW) received SNF consult. PT is recommending SNF. MD put in D/C order for patient today. CSW met with patient and her husband [place] was at bedside. Patient was alert and oriented X4 and was sitting up in the chair. CSW introduced self and explained role of CSW department. Patient reported that she lives in [place] with her husband [place]. Per husband patient has parkinson's and he doesn't feel he can meet her needs at home. CSW explained SNF process. Patient and husband are agreeable to SNF search in [facility] and prefer [facility]. FL2 complete and faxed out.   CSW presented bed offers and patient and husband chose [facility]. Patient is medically stable for D/C to [facility] today. Per [facility] admissions coordinator at [facility] patient can come today to room 403. RN will call report and arrange EMS  for transport. CSW sent D/C orders to [facility] via [place]. Please reconsult if future social work needs arise. CSW signing off.   Employment status:  Retired Nurse, adult PT Recommendations:  [place] / Referral to community resources:  [place]  Patient/Family's Response to care:  Patient and husband are agreeable for patient to D/C to [facility] today.   Patient/Family's Understanding of and Emotional Response to Diagnosis, Current Treatment, and Prognosis:  Patient and husband were very pleasant and thanked CSW for visit.   Emotional Assessment Appearance:  Appears stated age Attitude/Demeanor/Rapport:    Affect (typically observed):  Accepting, Adaptable, Pleasant Orientation:  Oriented to Self, Oriented to Place, Oriented to  Time, Oriented to Situation Alcohol / Substance use:  Not Applicable Psych involvement (Current and /or in the community):  No (Comment)  Discharge Needs  Concerns to be addressed:  Discharge Planning Concerns Readmission within the last 30 days:  No Current discharge risk:  None Barriers to Discharge:  No Barriers Identified   [place], [place]ets, LCSW 10/10/2016, 12:28 PM

## 2016-10-10 NOTE — Progress Notes (Signed)
[facility] Husband aware. IV removed  by patient. Paperwork and script sent. Report given.

## 2016-10-10 NOTE — Evaluation (Signed)
Physical Therapy Evaluation Patient Details Name: ANDRIANA CASA MRN: [id number] DOB: [birth year] Today's Date: 10/10/2016   History of Present Illness  71yo female pt s/p fall initially requiring closed reduction, due to dislocation on 09/29/16. Pt now requiring R reverse total shoulder surgical repair, on 10/09/16 and is POD #1 for PT evaluation. PMHx significant for Parkinson's, DM, vertigo, sleep apnea (CPAP at night), anxiety, nightmare disorder, and depression.   Clinical Impression  Pt is a pleasant 71 year old female who was admitted for R TSR. Pt performs transfers with mod assist initially using SPC, however progressed to [facility] with +2 assist. Limited ambulation performed with HW and +2 with close chair follow. Pt limited by dizziness and demonstrates severe post leaning with heavy cues/assist for correction. Pt demonstrates deficits with strength/balance/mobility. Would benefit from skilled PT to address above deficits and promote optimal return to PLOF; recommend transition to STR upon discharge from acute hospitalization.       Follow Up Recommendations SNF    Equipment Recommendations   [facility])    Recommendations for Other Services       Precautions / Restrictions Precautions Precautions: Shoulder;Fall Shoulder Interventions: Shoulder sling/immobilizer;Shoulder abduction pillow Precaution Booklet Issued: Yes (comment) Restrictions Weight Bearing Restrictions: Yes RUE Weight Bearing: Non weight bearing LUE Weight Bearing: Non weight bearing      Mobility  Bed Mobility Overal bed mobility: Needs Assistance Bed Mobility: Supine to Sit     Supine to sit: Mod assist     General bed mobility comments: not performed as pt received seated on BSC  Transfers Overall transfer level: Needs assistance Equipment used: Straight cane Transfers: Sit to/from Stand Sit to Stand: Mod assist Stand pivot transfers: Mod assist       General transfer comment: Cues given for hand  placement prior to standing. Once standing, heavy post leaning noted with cues for correction. Pt has difficulty with use of SPC. +2 for assist for cleaning post BSC use.  Ambulation/Gait Ambulation/Gait assistance: Mod assist;+2 physical assistance Ambulation Distance (Feet): 3 Feet Assistive device: Straight cane Gait Pattern/deviations: Step-to pattern   Gait velocity interpretation: Below normal speed for age/gender General Gait Details: Heavy cues and assist required for ambulation back to bed using SPC. Pt has difficulty moving feet to take steps. Post leaning noted.  Stairs            Wheelchair Mobility    Modified Rankin (Stroke Patients Only)       Balance Overall balance assessment: History of Falls;Needs assistance Sitting-balance support: Feet supported;Single extremity supported Sitting balance-Leahy Scale: Fair   Postural control: Posterior lean Standing balance support: Single extremity supported Standing balance-Leahy Scale: Poor Standing balance comment: significant posterior lean and BLE tremors requiring verbal cues for hand placement                             Pertinent Vitals/Pain Pain Assessment: Faces Pain Score: 5  Faces Pain Scale: Hurts little more Pain Location: R shoulder, neck pain (from fall) Pain Descriptors / Indicators: Aching;Operative site guarding Pain Intervention(s): Limited activity within patient's tolerance;Ice applied    Home Living Family/patient expects to be discharged to:: Private residence Living Arrangements: Spouse/significant other Available Help at Discharge: Family;Available 24 hours/day Type of Home: House Home Access: Stairs to enter Entrance Stairs-Rails: Lawyer of Steps: 3 STE from front with L and R hand rail; 4 STE from side w/ R hand rail Home Layout: One level  Home Equipment: Walker - 2 wheels;Shower seat;Cane - single point Additional Comments: access to RW but  was not using AD for mobility at baseline; spouse purchased shower chair in preparation for shoulder surgery    Prior Function Level of Independence: Needs assistance   Gait / Transfers Assistance Needed: ambulating without AD with "good days and bad days" of Parkinson's symptoms (shuffling feet, tremors); pt/spouse endorse symptoms have been getting worse more recently  ADL's / Homemaking Assistance Needed: spouse does more of the cooking/cleaning  Comments: pt endorses 1 fall that led to current admission for shoulder repair; endorses wearing regular socks      Hand Dominance   Dominant Hand: Left    Extremity/Trunk Assessment   Upper Extremity Assessment Upper Extremity Assessment: RUE deficits/detail RUE Deficits / Details: immobilized s/p surgery; pt reported some slight tingling in her fingers, which improved with verbal cues and encouragement to relax the shoulder and allow the sling to hold her arm RUE: Unable to fully assess due to immobilization    Lower Extremity Assessment Lower Extremity Assessment: Generalized weakness (B LE grossly 4/5)    Cervical / Trunk Assessment Cervical / Trunk Assessment: Normal  Communication   Communication: No difficulties  Cognition Arousal/Alertness: Awake/alert Behavior During Therapy: WFL for tasks assessed/performed Overall Cognitive Status: Within Functional Limits for tasks assessed                                 General Comments: slight confusion noted and pt repeats questions      General Comments      Exercises Other Exercises Other Exercises: discussed sling application, unsure of carryover. Will continue to monitor. Brought written HEP, will defer until next session. Other Exercises: Further transfers/ambulation performed with HW and +2 assist. Pt has difficulty holding the HW and use correctly. Hand over hand assist provided. Pt becomes dizzy with ambulation of 8' noted, reports feelings of syncope.  Further ambulation deferred. Needs min/mod assist for balance and close follow with recliner chair. +2 assist   Assessment/Plan    PT Assessment Patient needs continued PT services  PT Problem List Decreased strength;Decreased range of motion;Decreased activity tolerance;Decreased balance;Decreased mobility;Decreased cognition;Decreased safety awareness;Pain       PT Treatment Interventions DME instruction;Gait training;Stair training;Therapeutic exercise;Balance training    PT Goals (Current goals can be found in the Care Plan section)  Acute Rehab PT Goals Patient Stated Goal: to get better PT Goal Formulation: With patient Time For Goal Achievement: 10/24/16 Potential to Achieve Goals: Good    Frequency BID   Barriers to discharge Inaccessible home environment      Co-evaluation               AM-PAC PT "6 Clicks" Daily Activity  Outcome Measure Difficulty turning over in bed (including adjusting bedclothes, sheets and blankets)?: Total Difficulty moving from lying on back to sitting on the side of the bed? : Total Difficulty sitting down on and standing up from a chair with arms (e.g., wheelchair, bedside commode, etc,.)?: Total Help needed moving to and from a bed to chair (including a wheelchair)?: A Lot Help needed walking in hospital room?: A Lot Help needed climbing 3-5 steps with a railing? : A Lot 6 Click Score: 9    End of Session Equipment Utilized During Treatment: Gait belt Activity Tolerance: Patient tolerated treatment well Patient left: in chair;with chair alarm set;with nursing/sitter in room Nurse Communication: Mobility status;Precautions;Weight  bearing status PT Visit Diagnosis: Unsteadiness on feet (R26.81);Muscle weakness (generalized) (M62.81);History of falling (Z91.81);Difficulty in walking, not elsewhere classified (R26.2);Pain Pain - Right/Left: Right Pain - part of body: Shoulder    Time: 1610-9604 PT Time Calculation (min) (ACUTE  ONLY): 32 min   Charges:   PT Evaluation $PT Eval Moderate Complexity: 1 Procedure PT Treatments $Gait Training: 8-22 mins   PT G Codes:   PT G-Codes **NOT FOR INPATIENT CLASS** Functional Assessment Tool Used: AM-PAC 6 Clicks Basic Mobility Functional Limitation: Mobility: Walking and moving around Mobility: Walking and Moving Around Current Status (V4098): At least 60 percent but less than 80 percent impaired, limited or restricted Mobility: Walking and Moving Around Goal Status [phone]): At least 40 percent but less than 60 percent impaired, limited or restricted    Elizabeth Palau, PT, DPT [id number]   Alick Lecomte 10/10/2016, 11:28 AM

## 2016-10-10 NOTE — Discharge Summary (Signed)
Physician Discharge Summary  Patient ID: Lynn Bauer MRN: [id number] DOB/AGE: 71-Jul-1947 71 y.o.  Admit date: 10/09/2016 Discharge date: 10/10/2016  Admission Diagnoses:  closed fracture dislocation of right shoulder   Discharge Diagnoses: Patient Active Problem List   Diagnosis Date Noted  . 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 Internal hemorrhoids 03/01/2016  . Acute anxiety 11/22/2014  . 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 Depression 11/22/2014  . Sore throat 11/16/2014  Closed fracture-dislocation of the right shoulder. S/P right reverse total shoulder arthroplasty.  Past Medical History:  Diagnosis Date  . Anemia    hx in her 30's  . Anxiety   . Depression   . GERD (gastroesophageal reflux disease)   . Hemorrhoids   . Hypertension   . Neuromuscular disorder (HCC)   . Parkinson disease (HCC) 2014  . PONV (postoperative nausea and vomiting)   . Pre-diabetes   . Sleep apnea    Uses a C-Pap machine  . Vertigo    Transfusion: None.   Consultants (if any):   Discharged Condition: Improved  Hospital Course: Lynn Bauer is an 71 y.o. female who was admitted 10/09/2016 with a diagnosis of a closed fracture-dislocation of the right shoulder and went to the operating room on 10/09/2016 and underwent the above named procedures.    Surgeries: Procedure(s): REVERSE SHOULDER ARTHROPLASTY on 10/09/2016 Patient tolerated the surgery well. Taken to PACU where she was stabilized and then transferred to the orthopedic floor.  Started on Lovenox 40mg  q 24 hrs. Foot pumps applied bilaterally at 80 mm. Heels elevated on bed with rolled towels. No evidence of DVT. Negative Homan. Physical therapy started on day #1 for gait training and transfer. OT started day #1 for ADL and assisted devices.  Patient's IV and foley were d/c on POD1.  Implants: Hybrid Biomet Comprehensive system with a #6 cemented humeral fracture stem, a 9 mm centralizing cuff, a 44  mm humeral tray with a standard insert, and a mini-base plate with a 36 mm glenosphere.  She was given perioperative antibiotics:  Anti-infectives    Start     Dose/Rate Route Frequency Ordered Stop   10/09/16 1515  clindamycin (CLEOCIN) IVPB 900 mg     900 mg 100 mL/hr over 30 Minutes Intravenous Every 6 hours 10/09/16 1504 10/10/16 1259   10/09/16 0737  clindamycin (CLEOCIN) 900 MG/50ML IVPB    Comments:  jones, sherrie: cabinet override      10/09/16 0737 10/09/16 1053   10/09/16 0245  clindamycin (CLEOCIN) IVPB 900 mg     900 mg 100 mL/hr over 30 Minutes Intravenous  Once 10/09/16 0243 10/09/16 1108    .  She was given sequential compression devices, early ambulation, and Lovenox for DVT prophylaxis.  She benefited maximally from the hospital stay and there were no complications.    Recent vital signs:  Vitals:   10/10/16 0000 10/10/16 0456  BP: 129/80 130/60  Pulse: 77 79  Resp: 19 19  Temp: 97.8 F (36.6 C) 98.3 F (36.8 C)    Recent laboratory studies:  Lab Results  Component Value Date   HGB 9.9 (L) 10/10/2016   HGB 10.9 (L) 10/03/2016   HGB 13.4 09/28/2016   Lab Results  Component Value Date   WBC 9.4 10/10/2016   PLT 277 10/10/2016   Lab Results  Component Value Date   INR 1.09 10/03/2016   Lab Results  Component Value Date   NA 138 10/10/2016   K 4.6 10/10/2016  CL 104 10/10/2016   CO2 29 10/10/2016   BUN 16 10/10/2016   CREATININE 0.87 10/10/2016   GLUCOSE 147 (H) 10/10/2016    Discharge Medications:   Allergies as of 10/10/2016      Reactions   Erythromycin Hives   Penicillins Hives      Medication List    TAKE these medications   acetaminophen 500 MG chewable tablet Commonly known as:  TYLENOL Chew 500 mg by mouth every 6 (six) hours as needed for pain.   ALPRAZolam 0.5 MG tablet Commonly known as:  XANAX TAKE ONE TABLET THREE TIMES DAILY AS NEEDED FOR ANXIETY What changed:  how much to take  how to take this  when to  take this  reasons to take this  additional instructions   atorvastatin 20 MG tablet Commonly known as:  LIPITOR TAKE ONE TABLET AT BEDTIME   cholecalciferol 1000 units tablet Commonly known as:  VITAMIN D Take 2,000 Units by mouth daily.   FIBER 7 PO Take 3 tablets by mouth 3 (three) times daily.   hydroxypropyl methylcellulose / hypromellose 2.5 % ophthalmic solution Commonly known as:  ISOPTO TEARS / GONIOVISC Place 1 drop into both eyes as needed for dry eyes.   hydrOXYzine 25 MG tablet Commonly known as:  ATARAX/VISTARIL Take 25 mg by mouth 3 (three) times daily.   ibuprofen 200 MG tablet Commonly known as:  ADVIL,MOTRIN Take 200 mg by mouth every 6 (six) hours as needed for mild pain.   LINZESS 145 MCG Caps capsule Generic drug:  linaclotide TAKE ONE CAPSULE DAILY   metoprolol succinate 50 MG 24 hr tablet Commonly known as:  TOPROL-XL Take 50 mg by mouth daily.   omeprazole 20 MG capsule Commonly known as:  PRILOSEC Take 20 mg by mouth daily.   oxyCODONE 5 MG immediate release tablet Commonly known as:  ROXICODONE Take 1-2 tablets (5-10 mg total) by mouth every 4 (four) hours as needed for severe pain.   PARoxetine 40 MG tablet Commonly known as:  PAXIL Take 40 mg by mouth daily.   phenylephrine 10 MG Tabs tablet Commonly known as:  SUDAFED PE Take 10 mg by mouth every 4 (four) hours as needed.   psyllium 95 % Pack Commonly known as:  HYDROCIL/METAMUCIL Take 1 packet by mouth 3 (three) times daily as needed for mild constipation.   telmisartan 80 MG tablet Commonly known as:  MICARDIS TAKE 1 TABLET EVERY DAY   vitamin B-12 1000 MCG tablet Commonly known as:  CYANOCOBALAMIN Take 1,000 mcg by mouth daily.       Diagnostic Studies: Dg Shoulder Right  Result Date: 09/29/2016 CLINICAL DATA:  Dislocated right shoulder EXAM: DG C-ARM 61-120 MIN; RIGHT SHOULDER - 2+ VIEW COMPARISON:  09/28/2016 FINDINGS: Total fluoroscopy time was 42 seconds. A  single low resolution image of the right shoulder is submitted. Right humeral head appears to contact the glenoid. There is a comminuted fracture of the humeral head and glenoid. IMPRESSION: Fluoroscopic assistance provided for right shoulder dislocation Electronically Signed   By: Jasmine Pang M.D.   On: 09/29/2016 01:19   Dg Wrist Complete Right  Result Date: 09/28/2016 CLINICAL DATA:  Fall.  Right wrist pain EXAM: RIGHT WRIST - COMPLETE 3+ VIEW COMPARISON:  None FINDINGS: There is no evidence of fracture or dislocation. There is no evidence of arthropathy or other focal bone abnormality. Soft tissues are unremarkable. IMPRESSION: Negative. Electronically Signed   By: Signa Kell M.D.   On: 09/28/2016 19:32  Dg Shoulder Right Port  Result Date: 10/09/2016 CLINICAL DATA:  Status post reverse right shoulder joint replacement EXAM: PORTABLE RIGHT SHOULDER COMPARISON:  Preoperative x-ray of the right shoulder dated Sep 29, 2016 FINDINGS: Radiographic positioning of the prosthetic components is good. The interface with the native bone appears normal. A displaced native humeral head fragment is visible. IMPRESSION: Status post reverse left shoulder prosthesis placement on the right. No immediate postprocedure complication. A large humeral head fracture fragment remains visible in the surrounding soft tissues and is demonstrated best on the [place]. Electronically Signed   By: David  SwazilandJordan M.D.   On: 10/09/2016 14:24   Dg Shoulder Right Portable  Result Date: 09/28/2016 CLINICAL DATA:  Reduction attempt. EXAM: PORTABLE RIGHT SHOULDER COMPARISON:  RIGHT shoulder radiograph Sep 28, 2016 at 1859 hours FINDINGS: No interval change of the Anteroinferior RIGHT shoulder fracture dislocation including acutely fractured glenoid rim and humeral head. No destructive bony lesions. Soft tissue planes are nonsuspicious. IMPRESSION: Unchanged RIGHT shoulder fracture dislocation. Electronically Signed   By: Awilda Metroourtnay   Bloomer M.D.   On: 09/28/2016 23:00   Dg Humerus Right  Result Date: 09/28/2016 CLINICAL DATA:  Fall today with right shoulder pain. Initial encounter. EXAM: RIGHT HUMERUS - 2+ VIEW COMPARISON:  None. FINDINGS: Comminuted fracture of the proximal humerus with anterior dislocation of the shaft and neck. The fragments overlapping the glenoid are from uncertain donor site and CT may be needed. Intact acromioclavicular joint. IMPRESSION: Fracture and dislocation of the proximal humerus. Electronically Signed   By: Marnee SpringJonathon  Watts M.D.   On: 09/28/2016 19:31   Dg C-arm 1-60 Min  Result Date: 09/29/2016 CLINICAL DATA:  Dislocated right shoulder EXAM: DG C-ARM 61-120 MIN; RIGHT SHOULDER - 2+ VIEW COMPARISON:  09/28/2016 FINDINGS: Total fluoroscopy time was 42 seconds. A single low resolution image of the right shoulder is submitted. Right humeral head appears to contact the glenoid. There is a comminuted fracture of the humeral head and glenoid. IMPRESSION: Fluoroscopic assistance provided for right shoulder dislocation Electronically Signed   By: Jasmine PangKim  Fujinaga M.D.   On: 09/29/2016 01:19   Disposition: Plan will be for discharge home today pending PT and urinating without a foley cath.   Signed: Meriel PicaJames L Jerran Tappan PA-C 10/10/2016, 7:43 AM

## 2016-10-10 NOTE — Evaluation (Signed)
Occupational Therapy Evaluation Patient Details Name: Lynn Bauer MRN: [id number] DOB: Mar 28, 1946 Today's Date: 10/10/2016    History of Present Illness 70yo female pt s/p fall requiring R reverse total shoulder surgical repair, POD #1 for OT evaluation. PMHx significant for Parkinson's, DM, vertigo, sleep apnea (CPAP at night), anxiety, nightmare disorder, and depression.    Clinical Impression   Pt seen for OT evaluation this date, s/p above surgerical repair of R shoulder. Pt was independent with basic self care tasks at baseline, but endorses Parkinson's symptoms, primarily tremors and shuffling gait, were getting worse more recently. Pt and spouse live in single family home, 1 story, with 3 steps and bilateral hand rails for the front entrance, and a tub/shower. Pt was not using any AD for mobility at baseline, stating "I take it slow, and I've been getting slower". Pt endorses 1 fall in past 12 months leading to shoulder injury and current admission. More recently, pt has been experiencing increased difficulty with her ability to participate in meaningful preferred activities due to her Parkinson's tremors including art (drawing, painting). Pt and spouse educated in [facility] resources available with a handout provided to support Parkinson's as well as educated pt/spouse in AE/DME for ADL and home/routines modifications to support safety, falls prevention, and functional independence. Pt required mod assist for bed mobility and transfer to [facility] with significant posterior lean, BLE tremors, and verbal cues for hand placement to support safety. Pt encouraged to relax R shoulder as well to minimize pain and adhere to shoulder precautions. Due to impaired mobility that is not her baseline, pt is at an increased falls risk. Pt would benefit from skilled OT services to address noted impairments and functional deficits due to pain, decreased mobility/balance/strength/ROM/and impaired UE use and would  benefit from STR following hospitalization to continue to progress.    Follow Up Recommendations  SNF    Equipment Recommendations  3 in 1 bedside commode;Other (comment) (grab bars in shower, BSC over toilet and beside bed overnight, gait belt, consider tub transfer bench if shower chair at home does not work)    Recommendations for Other Services       Precautions / Restrictions Precautions Precautions: Shoulder;Fall Shoulder Interventions: Shoulder sling/immobilizer;Shoulder abduction pillow Restrictions Weight Bearing Restrictions: Yes RUE Weight Bearing: Non weight bearing LUE Weight Bearing: Non weight bearing      Mobility Bed Mobility Overal bed mobility: Needs Assistance Bed Mobility: Supine to Sit     Supine to sit: Mod assist     General bed mobility comments: assist to support turning hips and scooting EOB with verbal cues for sequencing and hand placement of LUE to assist  Transfers Overall transfer level: Needs assistance Equipment used: 1 person hand held assist Transfers: Sit to/from [employer] Sit to Stand: Mod assist Stand pivot transfers: Mod assist       General transfer comment: verbal cues for hand placement, significant posterior lean    Balance Overall balance assessment: History of Falls;Needs assistance Sitting-balance support: Feet supported;Single extremity supported Sitting balance-Leahy Scale: Fair   Postural control: Posterior lean Standing balance support: Bilateral upper extremity supported Standing balance-Leahy Scale: Poor Standing balance comment: significant posterior lean and BLE tremors requiring verbal cues for hand placement                           ADL either performed or assessed with clinical judgement   ADL Overall ADL's : Needs assistance/impaired Eating/Feeding: Set  up;Bed level   Grooming: Set up;Bed level   Upper Body Bathing: Moderate assistance;Bed level   Lower Body Bathing:  Moderate assistance;Maximal assistance;Bed level   Upper Body Dressing : Moderate assistance;Sitting   Lower Body Dressing: Maximal assistance   Toilet Transfer: Stand-pivot;BSC;Moderate assistance;Cueing for safety Toilet Transfer Details (indicate cue type and reason): pt able to perform SPT from EOB > BSC w/ verbal cues for hand placement and moderate assistance with significant posterior lean and tremors in BLE Toileting- Clothing Manipulation and Hygiene: Maximal assistance;Sit to/from stand Toileting - Clothing Manipulation Details (indicate cue type and reason): required OT to perform perineal hygiene while in standing       General ADL Comments: pt generally mod assist for UB dressing/bathing and max assist for LB ADL     Vision Baseline Vision/History: Wears glasses Wears Glasses: Distance only (watching tv) Patient Visual Report: No change from baseline Vision Assessment?: No apparent visual deficits     Perception     Praxis      Pertinent Vitals/Pain Pain Assessment: 0-10 Pain Score: 5  Pain Location: R shoulder, neck pain (from fall) Pain Descriptors / Indicators: Aching;Operative site guarding Pain Intervention(s): Limited activity within patient's tolerance;Monitored during session;Repositioned;Ice applied     Hand Dominance Left   Extremity/Trunk Assessment Upper Extremity Assessment Upper Extremity Assessment: RUE deficits/detail (LUE WFL) RUE Deficits / Details: immobilized s/p surgery; pt reported some slight tingling in her fingers, which improved with verbal cues and encouragement to relax the shoulder and allow the sling to hold her arm RUE: Unable to fully assess due to immobilization   Lower Extremity Assessment Lower Extremity Assessment: Defer to PT evaluation (Parkinson's tremors)   Cervical / Trunk Assessment Cervical / Trunk Assessment: Normal   Communication Communication Communication: No difficulties   Cognition Arousal/Alertness:  Awake/alert Behavior During Therapy: WFL for tasks assessed/performed Overall Cognitive Status: Within Functional Limits for tasks assessed                                 General Comments: minimal verbal cues for attention/redirect   General Comments       Exercises Other Exercises Other Exercises: pt/spouse educated in AE/DME including home/routines modifications to support falls prevention and maximize functional independence with ADL; ECS handout provided as well as handout with [place] Parkinson's resources and LSVT-BIG program information   Shoulder Instructions      Home Living Family/patient expects to be discharged to:: Private residence Living Arrangements: Spouse/significant other Available Help at Discharge: Family;Available 24 hours/day Type of Home: House Home Access: Stairs to enter Enter[employer] CorporationEntrance Stairs-Number of Steps: 3 STE from front with L and R hand rail; 4 STE from side w/ R hand rail Entrance Stairs-Rails: Left;Right Home Layout: One level     Bathroom Shower/Tub: Tub/shower unit;Curtain   FirefighterBathroom Toilet: Standard     Home Equipment: Environmental consultantWalker - 2 wheels;Shower seat   Additional Comments: access to RW but was not using AD for mobility at baseline; spouse purchased shower chair in preparation for shoulder surgery      Prior Functioning/Environment Level of Independence: Needs assistance  Gait / Transfers Assistance Needed: ambulating without AD with "good days and bad days" of Parkinson's symptoms (shuffling feet, tremors); pt/spouse endorse symptoms have been getting worse more recently ADL's / Homemaking Assistance Needed: spouse does more of the cooking/cleaning   Comments: pt endorses 1 fall that led to current admission for shoulder repair; endorses  wearing regular socks         OT Problem List: Decreased coordination;Pain;Decreased range of motion;Decreased safety awareness;Decreased activity tolerance;Impaired UE functional  use;Impaired balance (sitting and/or standing);Decreased knowledge of use of DME or AE      OT Treatment/Interventions: Self-care/ADL training;Energy conservation;DME and/or AE instruction;Patient/family education;Therapeutic exercise;Therapeutic activities    OT Goals(Current goals can be found in the care plan section) Acute Rehab OT Goals Patient Stated Goal: to get better OT Goal Formulation: With patient/family Time For Goal Achievement: 10/24/16 Potential to Achieve Goals: Good  OT Frequency: Min 2X/week   Barriers to D/C: Inaccessible home environment;Decreased caregiver support  spouse unable to provide extensive physical assist       Co-evaluation              AM-PAC PT "6 Clicks" Daily Activity     Outcome Measure Help from another person eating meals?: None Help from another person taking care of personal grooming?: A Little Help from another person toileting, which includes using toliet, bedpan, or urinal?: A Lot Help from another person bathing (including washing, rinsing, drying)?: A Lot Help from another person to put on and taking off regular upper body clothing?: A Lot Help from another person to put on and taking off regular lower body clothing?: A Lot 6 Click Score: 15   End of Session Equipment Utilized During Treatment: Gait belt Nurse Communication: Mobility status;Other (comment) (notified RN of IV completion, mobility status in light of Parkinson's tremors, discharge recommendation)  Activity Tolerance: Other (comment) (pt limited by tremors) Patient left: Other (comment) (pt seated on BSC with PT in room for therapy)  OT Visit Diagnosis: Other abnormalities of gait and mobility (R26.89);History of falling (Z91.81)                Time: 1610-9604 OT Time Calculation (min): 42 min Charges:  OT General Charges $OT Visit: 1 Procedure OT Evaluation $OT Eval Moderate Complexity: 1 Procedure OT Treatments $Self Care/Home Management : 23-37  mins G-Codes: OT G-codes **NOT FOR INPATIENT CLASS** Functional Assessment Tool Used: AM-PAC 6 Clicks Daily Activity;Clinical judgement Functional Limitation: Self care Self Care Current Status (V4098): At least 40 percent but less than 60 percent impaired, limited or restricted Self Care Goal Status (J1914): At least 40 percent but less than 60 percent impaired, limited or restricted   Richrd Prime, MPH, MS, OTR/L ascom [phone] 10/10/16, 12:15 PM

## 2016-10-10 NOTE — Clinical Social Work Placement (Signed)
   CLINICAL SOCIAL WORK PLACEMENT  NOTE  Date:  10/10/2016  Patient Details  Name: Lynn Bauer MRN: [id number] Date of Birth: 04/09/1946  Clinical Social Work is seeking post-discharge placement for this patient at the Skilled  Nursing Facility level of care (*CSW will initial, date and re-position this form in  chart as items are completed):  Yes   Patient/family provided with [facility] Clinical Social Work Department's list of facilities offering this level of care within the geographic area requested by the patient (or if unable, by the patient's family).  Yes   Patient/family informed of their freedom to choose among providers that offer the needed level of care, that participate in Medicare, Medicaid or managed care program needed by the patient, have an available bed and are willing to accept the patient.  Yes   Patient/family informed of [facility]'s ownership interest [facility] and [facility], as well as of the fact that they are under no obligation to receive care at these facilities.  PASRR submitted to EDS on 10/09/16     PASRR number received on 10/09/16     Existing PASRR number confirmed on       FL2 transmitted to all facilities in geographic area requested by pt/family on 10/10/16     FL2 transmitted to all facilities within larger geographic area on       Patient informed that his/her managed care company has contracts with or will negotiate with certain facilities, including the following:        Yes   Patient/family informed of bed offers received.  Patient chooses bed at  [facility] )     Physician recommends and patient chooses bed at      Patient to be transferred to  [facility] ) on 10/10/16.  Patient to be transferred to facility by  [facility] )     Patient family notified on 10/10/16 of transfer.  Name of family member notified:   (Patient's husband [name] is at bedside and aware of D/C today. )      PHYSICIAN       Additional Comment:    _______________________________________________ [name], Darleen CrockerBailey M, LCSW 10/10/2016, 12:27 PM

## 2016-10-10 NOTE — Progress Notes (Signed)
Pt alert but does have some forgetfulness. No complaints of pain this shift. Foley patent and draining urine. Iv infusing without difficulty. Surgical dressing remaining dry and intact. Tolerating polar care without difficulty.

## 2016-10-10 NOTE — Discharge Instructions (Signed)
Diet: As you were doing prior to hospitalization   Shower:  May shower but keep the wounds dry, use an occlusive plastic wrap, NO SOAKING IN TUB.  If the bandage gets wet, change with a clean dry gauze.  Dressing:  You may change your dressing as needed. Change the dressing with sterile gauze dressing.    Activity:  Increase activity slowly as tolerated, but follow the weight bearing instructions below.  No lifting or driving for 6 weeks.  Weight Bearing:   Non-weightbearing to the right arm.  Blood Clot Prevention:  Begin taking a 325mg  Aspirin twice daily.  To prevent constipation: you may use a stool softener such as -  Colace (over the counter) 100 mg by mouth twice a day  Drink plenty of fluids (prune juice may be helpful) and high fiber foods Miralax (over the counter) for constipation as needed.    Itching:  If you experience itching with your medications, try taking only a single pain pill, or even half a pain pill at a time.  You may take up to 10 pain pills per day, and you can also use benadryl over the counter for itching or also to help with sleep.   Precautions:  If you experience chest pain or shortness of breath - call 911 immediately for transfer to the hospital emergency department!!  If you develop a fever greater that 101 F, purulent drainage from wound, increased redness or drainage from wound, or calf pain-Call [facility].                                              Follow- Up Appointment:  Please call for an appointment to be [facility] in [facility] weeks at [facility]

## 2016-10-11 LAB — SURGICAL PATHOLOGY

## 2016-10-12 NOTE — Anesthesia Postprocedure Evaluation (Signed)
Anesthesia Post Note  Patient: Olivia Mackieoni P Wachter  Procedure(s) Performed: Procedure(s) (LRB): REVERSE SHOULDER ARTHROPLASTY (Right)  Patient location during evaluation: PACU Anesthesia Type: Regional and General Level of consciousness: awake and alert Pain management: pain level controlled Vital Signs Assessment: post-procedure vital signs reviewed and stable Respiratory status: spontaneous breathing, nonlabored ventilation, respiratory function stable and patient connected to nasal cannula oxygen Cardiovascular status: blood pressure returned to baseline and stable Postop Assessment: no signs of nausea or vomiting Anesthetic complications: no     Last Vitals:  Vitals:   10/10/16 0800 10/10/16 1430  BP: (!) 122/51 (!) 130/48  Pulse: 76 83  Resp: 18 18  Temp: 36.7 C 36.7 C    Last Pain:  Vitals:   10/10/16 1430  TempSrc: Oral  PainSc:                  Lenard SimmerAndrew Derrius Furtick

## 2016-11-27 ENCOUNTER — Encounter: Payer: Self-pay | Admitting: Surgery

## 2016-12-19 ENCOUNTER — Ambulatory Visit: Payer: Medicare Other | Attending: Surgery | Admitting: Occupational Therapy

## 2016-12-19 DIAGNOSIS — M25641 Stiffness of right hand, not elsewhere classified: Secondary | ICD-10-CM | POA: Insufficient documentation

## 2016-12-19 DIAGNOSIS — M6281 Muscle weakness (generalized): Secondary | ICD-10-CM | POA: Diagnosis present

## 2016-12-19 DIAGNOSIS — R208 Other disturbances of skin sensation: Secondary | ICD-10-CM | POA: Insufficient documentation

## 2016-12-19 NOTE — Therapy (Signed)
[place] [facility] PHYSICAL AND SPORTS MEDICINE [address] [place], [place], [address] Phone: [phone]   Fax:  [phone]  Occupational Therapy Evaluation  Patient Details  Name: Lynn Bauer MRN: [id number] Date of Birth: 09/29/1945 Referring Provider: Joice LoftsPoggi  Encounter Date: 12/19/2016      OT End of Session - 12/19/16 1559    Visit Number 1   Number of Visits 3   Date for OT Re-Evaluation 03/13/17   OT Start Time 1320   OT Stop Time 1426   OT Time Calculation (min) 66 min   Activity Tolerance Patient tolerated treatment well   Behavior During Therapy [facility] for tasks assessed/performed      Past Medical History:  Diagnosis Date  . Anemia    hx in her 30's  . Anxiety   . Depression   . GERD (gastroesophageal reflux disease)   . Hemorrhoids   . Hypertension   . Neuromuscular disorder (HCC)   . Parkinson disease (HCC) 2014  . PONV (postoperative nausea and vomiting)   . Pre-diabetes   . Sleep apnea    Uses a C-Pap machine  . Vertigo     Past Surgical History:  Procedure Laterality Date  . ABDOMINAL HYSTERECTOMY    . COLONOSCOPY  2010  . COLONOSCOPY WITH PROPOFOL N/A 02/01/2016   Procedure: COLONOSCOPY WITH PROPOFOL;  Surgeon: Earline MayotteJeffrey W Byrnett, MD;  [facility];  Service: Endoscopy;  Laterality: N/A;  . HEMORRHOID BANDING N/A 02/01/2016   Procedure: Eagle LionsHEMORRHOID BANDING;  Surgeon: Earline MayotteJeffrey W Byrnett, MD;  [facility];  Service: Endoscopy;  Laterality: N/A;  . REVERSE SHOULDER ARTHROPLASTY Right 10/09/2016   Procedure: REVERSE SHOULDER ARTHROPLASTY;  Surgeon: Christena FlakePoggi, John J, MD;  Location: [facility] ORS;  Service: Orthopedics;  Laterality: Right;  . SHOULDER CLOSED REDUCTION Right 09/28/2016   Procedure: CLOSED REDUCTION SHOULDER;  Surgeon: Christena FlakePoggi, John J, MD;  Location: [facility] ORS;  Service: Orthopedics;  Laterality: Right;    There were no vitals filed for this visit.      Subjective Assessment - 12/19/16 1544     Subjective  Fell and had shoulder surgery about 10 wks ago - [facility] then outpt PT for about 6 wks - but my hand and forearm strength limiting my progress in shoulder rehab - they wanted me to see you    Patient Stated Goals Want to be able to do more for myself - before fall I was able to bath and dress , do some things around the house, drive    Currently in Pain? No/denies           [facility] Assessment - 12/19/16 0001      Assessment   Diagnosis R reverse shoulder replace ment    Referring Provider Poggi   Onset Date 10/09/16     Home  Environment   Lives With Spouse     Prior Function   Vocation Retired   Leisure L hand dominant , before fall did tasks around the house except  cooking , drove too -         Recommend nerve conduction test  And BIG LSVT  Pt has appt with Dr Sherryll BurgerShah this coming Friday - left message with nurse  Recommend for pt to use cuff weight and strap hand in if needed  Pt has sensory issues to decrease her proprioception gripping objects    Pt to do at home contrast  Tendon glides to decrease edema and increase  ROM  Putty teal for gripping , lat and 3 point grip  Will contact me in 2-3 wks for possible follow up                   OT Education - 12/19/16 1559    Education provided Yes   Education Details findings , recommendations and HEP    Person(s) Educated Patient;Spouse   Methods Explanation;Demonstration;Tactile cues;Verbal cues;Handout   Comprehension Verbal cues required;Returned demonstration;Verbalized understanding             OT Long Term Goals - 12/19/16 1607      OT LONG TERM GOAL #1   Title Pt to show improvement in sensation with area of numbness less, and increase grip/ prehension strength and full fisting to palm    Baseline 2nd digit PROM to palm -but unable to make full AROM fist - see numbness assessment    Time 12   Period Weeks   Status New               Plan - 12/19/16 1600     Clinical Impression Statement Pt present at eval with diagnosis of 10 wks s/p reverse R shoulder replacement after fall with distal humerus fx - pt refer to OT for weakness and stiffness in hand and wrist - pt show decrease sensation in anterior upper arm, over radial side of forearm and medial n in hand and thumb thru 4th digits - pt show AROM in elbow distally WNL except 2nd digit - that has some edema and decreaes ROM in ful flexion - pt do show decrease grip and prehension  but would think it  is partially because of nerve involvement and surgery - strength in wrist  4+/5 - sup/pro, elbow flexion and extenion decrease but because of  shoulder surgery - ? trauma to brachial plexus with fall - would recommmend nerve conduction test - pt has appt with Dr Sherryll Burger on Friday - also had big change in function since fall  - and with Parkinsons daignosis with recommend  BIG LSVT  - would recommend PT to cont with  shoulder rehab but  modify using cuff weight or strap hand in /iuse mitton to assist with grip - will follow up with pt in about 3 wks    Occupational performance deficits (Please refer to evaluation for details): ADL's;IADL's;Play;Leisure;Social Participation   Rehab Potential Fair   OT Frequency Monthly   OT Duration 12 weeks   Clinical Decision Making Multiple treatment options, significant modification of task necessary   OT Home Exercise Plan see pt in structions    Consulted and Agree with Plan of Care Patient      Patient will benefit from skilled therapeutic intervention in order to improve the following deficits and impairments:  Impaired flexibility, Decreased range of motion, Impaired UE functional use, Impaired sensation, Increased edema  Visit Diagnosis: Other disturbances of skin sensation - Plan: Ot plan of care cert/re-cert  Stiffness of right hand, not elsewhere classified - Plan: Ot plan of care cert/re-cert  Muscle weakness (generalized) - Plan: Ot plan of care  cert/re-cert    Problem List Patient Active Problem List   Diagnosis Date Noted  . 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 Internal hemorrhoids 03/01/2016  . Acute anxiety 11/22/2014  . 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 Depression 11/22/2014  . Sore throat 11/16/2014    Oletta Cohn OTR/L,CLT 12/19/2016, 4:13 PM  [facility] PHYSICAL AND  SPORTS MEDICINE [address] [place], [place], [address] Phone: [phone]   Fax:  [phone]  Name: Lynn Bauer MRN: [id number] Date of Birth: 04/25/1946

## 2016-12-19 NOTE — Patient Instructions (Signed)
Recommend nerve conduction test  And BIG LSVT  Pt has appt with Dr Sherryll BurgerShah this coming Friday - left message with nurse  Recommend for pt to use cuff weight and strap hand in if needed  Pt has sensory issues to decrease her proprioception gripping objects    Pt to do at home contrast  Tendon glides to decrease edema and increase ROM  Putty teal for gripping , lat and 3 point grip  Will contact me in 2-3 wks for possible follow up

## 2017-03-05 ENCOUNTER — Ambulatory Visit: Payer: Medicare Other | Attending: Internal Medicine | Admitting: Physical Therapy

## 2017-03-05 ENCOUNTER — Encounter: Payer: Self-pay | Admitting: Physical Therapy

## 2017-03-05 ENCOUNTER — Ambulatory Visit: Payer: Medicare Other | Admitting: Physical Therapy

## 2017-03-05 DIAGNOSIS — R262 Difficulty in walking, not elsewhere classified: Secondary | ICD-10-CM | POA: Diagnosis present

## 2017-03-05 DIAGNOSIS — M6281 Muscle weakness (generalized): Secondary | ICD-10-CM

## 2017-03-05 NOTE — Therapy (Signed)
[facility] MAIN [facility] SERVICES [address] [place], [place], [address] Phone: [phone]   Fax:  [phone]  Physical Therapy Evaluation  Patient Details  Name: Lynn Bauer MRN: [id number] Date of Birth: 1946-03-29 Referring Provider: mark Hyacinth Meeker  Encounter Date: 03/05/2017      PT End of Session - 03/05/17 1419    Visit Number 1   Number of Visits 17   Date for PT Re-Evaluation 04/08/17   Authorization Type medicare 1/10   PT Start Time 0200   PT Stop Time 0300   PT Time Calculation (min) 60 min   Equipment Utilized During Treatment Gait belt   Activity Tolerance Patient tolerated treatment well;Patient limited by fatigue   Behavior During Therapy [facility] for tasks assessed/performed      Past Medical History:  Diagnosis Date  . Anemia    hx in her 30's  . Anxiety   . Depression   . GERD (gastroesophageal reflux disease)   . Hemorrhoids   . Hypertension   . Neuromuscular disorder (HCC)   . Parkinson disease (HCC) 2014  . PONV (postoperative nausea and vomiting)   . Pre-diabetes   . Sleep apnea    Uses a C-Pap machine  . Vertigo     Past Surgical History:  Procedure Laterality Date  . ABDOMINAL HYSTERECTOMY    . COLONOSCOPY  2010  . COLONOSCOPY WITH PROPOFOL N/A 02/01/2016   Procedure: COLONOSCOPY WITH PROPOFOL;  Surgeon: Earline Mayotte, MD;  Location: [facility] ENDOSCOPY;  Service: Endoscopy;  Laterality: N/A;  . HEMORRHOID BANDING N/A 02/01/2016   Procedure: [place];  Surgeon: Earline Mayotte, MD;  Location: [facility] ENDOSCOPY;  Service: Endoscopy;  Laterality: N/A;  . REVERSE SHOULDER ARTHROPLASTY Right 10/09/2016   Procedure: REVERSE SHOULDER ARTHROPLASTY;  Surgeon: Christena Flake, MD;  Location: [facility] ORS;  Service: Orthopedics;  Laterality: Right;  . SHOULDER CLOSED REDUCTION Right 09/28/2016   Procedure: CLOSED REDUCTION SHOULDER;  Surgeon: Christena Flake, MD;  Location: [facility] ORS;  Service: Orthopedics;  Laterality:  Right;    There were no vitals filed for this visit.       Subjective Assessment - 03/05/17 1411    Subjective Patient has trouble keeping her brain straight. She has difficulty sleeping, difficulty with standing up out of a chair,  difficulty walking and standing.    Patient is accompained by: Family member   Pertinent History Patient was diagnosed with parkinsons 2015, she just started taking medicine for parkinsons disease 2 months ago and her tremors improved in her LE's.  she had a right shoulder replacement in may 2018. She was having a lot of difficulty with staning and balance.  she is not able to read due to not able to focus her eyes. She had 2 falls in the last 6 months.    Limitations Lifting;Standing;Walking;House hold activities;Reading;Writing   How long can you stand comfortably? 10 minutes   How long can you walk comfortably? 10 minutes   Patient Stated Goals to be able to be better with her walking and not fall.   Currently in Pain? Yes   Pain Location Shoulder   Pain Orientation Right   Pain Descriptors / Indicators Aching   Pain Type Chronic pain   Pain Frequency Intermittent   Aggravating Factors  If she moves her right arm   Pain Relieving Factors not radiating   Effect of Pain on Daily Activities not able to hook her bra   Multiple Pain Sites --  right rib pain intermittent            [facility] PT Assessment - 03/05/17 1420      Assessment   Medical Diagnosis parkinsons   Referring Provider mark miller   Onset Date/Surgical Date --  2015   Hand Dominance Left   Prior Therapy no     Precautions   Precautions Fall     Balance Screen   Has the patient fallen in the past 6 months Yes   Has the patient had a decrease in activity level because of a fear of falling?  Yes   Is the patient reluctant to leave their home because of a fear of falling?  No     Home Environment   Living Environment Private residence   Living Arrangements Spouse/significant  other   Available Help at Discharge Family   Type of Home House   Home Access Stairs to enter   Entrance Stairs-Number of Steps 3   Entrance Stairs-Rails Right;Left   Home Layout Two level   Alternate Level Stairs-Number of Steps 12   Alternate Level Stairs-Rails None   Home [employer] - single point;Cane - quad;Walker - 2 wheels;Grab bars - toilet;Grab bars - tub/shower;Shower seat;Hand held shower head     Prior Function   Level of Independence Independent with household mobility without device;Needs assistance with ADLs;Needs assistance with homemaking   Vocation Retired   [employer] retired   Leisure L hand dominant , before fall did tasks around the house except  cooking , drove too -      Cognition   Overall Cognitive Status Impaired/Different from baseline      PAIN: No reports of pain  POSTURE:  rounded shoulders   PROM/AROM: RUE PROM and AROM  RUE NT due to increased pain   STRENGTH:  Graded on a 0-5 scale Muscle Group Left Right                          Hip Flex 3/+5 3/+5  Hip Abd 3/+5 NT  Hip Add NT 2/5  Hip Ext NT NT  Hip IR/ER NT NT  Knee Flex 5/5 5/5  Knee Ext 5/5 5/5  Ankle DF 5/5 5/5  Ankle PF 3/5 3/5   SENSATION: WNL BUE and BLE   FUNCTIONAL MOBILITY: Patient transfers with mod assist for sit to stand and has slow movement for rolling to the left, unable to roll to the right due to right shoulder surgery in may.   GAIT:Decreased gait speed, shuffling feet intermittently, decreased arm swing bilaterally, no AD  OUTCOME MEASURES: TEST Outcome Interpretation  5 times sit<>stand 1.18 sec >60 yo, >15 sec indicates increased risk for falls  10 meter walk test    .76             m/s <1.0 m/s indicates increased risk for falls; limited community ambulator  Timed up and Go    17.43             sec <14 sec indicates increased risk for falls  6 minute walk test    930            Feet 1000 feet is community Teacher, music  <36/56 (100% risk for falls), 37-45 (80% risk for falls); 46-51 (>50% risk for falls); 52-55 (lower risk <25% of falls)  9 Hole Peg Test L: 36.96  R: 1 min 32.73     BALANCE: Standing Dynamic Balance  Normal Stand independently unsupported, able to weight shift and cross midline maximally   Good Stand independently unsupported, able to weight shift and cross midline moderately x  Good-/Fair+ Stand independently unsupported, able to weight shift across midline minimally   Fair Stand independently unsupported, weight shift, and reach ipsilaterally, loss of balance when crossing midline   Poor+ Able to stand with Min A and reach ipsilaterally, unable to weight shift   Poor Able to stand with Mod A and minimally reach ipsilaterally, unable to cross midline.            Objective measurements completed on examination: See above findings.                  PT Education - 03/05/17 1419    Education provided Yes   Education Details plan of care   Person(s) Educated Patient;Spouse   Methods Explanation;Demonstration   Comprehension Verbalized understanding;Returned demonstration          PT Short Term Goals - 03/05/17 1541      PT SHORT TERM GOAL #1   Title Patient (> 32 years old) will complete five times sit to stand test in < 15 seconds indicating an increased LE strength and improved balance.   Baseline 1.18 sec with use of LUE to push up   Time 2   Period Weeks   Status New   Target Date 03/21/17     PT SHORT TERM GOAL #2   Title Patient will be independent in home exercise program to improve strength/mobility for better functional independence with ADLs.   Baseline no HEP   Time 2   Period Weeks   Status New   Target Date 03/21/17           PT Long Term Goals - 03/05/17 1539      PT LONG TERM GOAL #1   Title Patient will reduce timed up and go to <11 seconds to reduce fall risk and demonstrate improved transfer/gait ability.    Baseline 17.43 sec with UE to push up   Time 4   Period Weeks   Status New   Target Date 04/08/17     PT LONG TERM GOAL #2   Title Patient will increase 10 meter walk test to >1.63m/s as to improve gait speed for better community ambulation and to reduce fall risk.   Baseline . 76 m/sec    Time 4   Period Weeks   Status New   Target Date 04/08/17     PT LONG TERM GOAL #3   Title Patient will increase six minute walk test distance to >1000 for progression to community ambulator and improve gait ability   Baseline 930    Time 4   Period Weeks   Status New   Target Date 04/08/17     PT LONG TERM GOAL #4   Title Pateint will improve the 9 peg hole test by 6 seconds to indicate improved function to be able to perform tasks with improved hand coordination.    Baseline 36.96 L ;  32.73 right   Time 4   Period Weeks   Status New   Target Date 04/08/17                Plan - 03/05/17 1532    Clinical Impression Statement Patient is 71 year old female with Dx of parkinsons disease and is referred to PT for  LSVT BIG. She presents with tremor to RUE and RLE, difficulty walking, difficulty with sit to stand mobility. She has a history of 2 falls, reports intermittent dizziness,  and has unsteady dynamic standing balance. She ambulate with decreased arm swing and shuffling gait with decreased gait speed. She has decreased strength BLE hips 3+/5. She reports mild cognition changes and has supportive husband. She will benefit from skilled PT to improve gait, balance ,mobility and quality of life.    History and Personal Factors relevant to plan of care: steps, decreased participation in social events   Clinical Presentation Evolving   Clinical Presentation due to: falls x 2, right shoulder pain that is intermittent, dizziness intermittent   Clinical Decision Making Moderate   Rehab Potential Good   PT Frequency 4x / week   PT Duration 4 weeks   PT Treatment/Interventions Gait  training;Functional mobility training;Stair training;Balance training;Therapeutic exercise;Therapeutic activities;Neuromuscular re-education;Patient/family education   PT Next Visit Plan LSVT BIG   PT Home Exercise Plan LSVT BIG   Consulted and Agree with Plan of Care Patient;Family member/caregiver      Patient will benefit from skilled therapeutic intervention in order to improve the following deficits and impairments:  Abnormal gait, Decreased balance, Decreased endurance, Decreased mobility, Difficulty walking, Pain, Impaired UE functional use, Impaired flexibility, Decreased strength, Decreased coordination, Decreased activity tolerance, Decreased cognition, Dizziness  Visit Diagnosis: Difficulty in walking, not elsewhere classified  Muscle weakness (generalized)      G-Codes - 04-04-17 1532    Functional Assessment Tool Used (Outpatient Only) TUG, 6 MW, 10 MW, 5 x sit to stand   Functional Limitation Mobility: Walking and moving around   Mobility: Walking and Moving Around Current Status [phone]) At least 40 percent but less than 60 percent impaired, limited or restricted   Mobility: Walking and Moving Around Goal Status [phone]) At least 20 percent but less than 40 percent impaired, limited or restricted       Problem List Patient Active Problem List   Diagnosis Date Noted  . 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 Internal hemorrhoids 03/01/2016  . Acute anxiety 11/22/2014  . 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 Depression 11/22/2014  . Sore throat 11/16/2014    Ezekiel Ina, PT DPT Apr 04, 2017, 3:54 PM  [place] [facility] MAIN [facility] SERVICES [address] [place], [place], [address] Phone: [phone]   Fax:  [phone]  Name: Lynn Bauer MRN: [id number] Date of Birth: [birth year]

## 2017-03-11 ENCOUNTER — Ambulatory Visit: Payer: Medicare Other | Admitting: Physical Therapy

## 2017-03-11 ENCOUNTER — Encounter: Payer: Self-pay | Admitting: Physical Therapy

## 2017-03-11 DIAGNOSIS — R262 Difficulty in walking, not elsewhere classified: Secondary | ICD-10-CM | POA: Diagnosis not present

## 2017-03-11 DIAGNOSIS — M6281 Muscle weakness (generalized): Secondary | ICD-10-CM

## 2017-03-11 NOTE — Therapy (Signed)
[place] [facility] MAIN [facility] SERVICES [address] [place], [place], [address] Phone: [phone]   Fax:  [phone]  Physical Therapy Treatment  Patient Details  Name: Lynn Bauer MRN: [id number] Date of Birth: [birth year] Referring Provider: mark Hyacinth Meeker  Encounter Date: 03/11/2017      PT End of Session - 03/11/17 1513    Visit Number 2   Number of Visits 17   Date for PT Re-Evaluation 04/08/17   Authorization Type medicare 2/10   PT Start Time 0200   PT Stop Time 0300   PT Time Calculation (min) 60 min   Equipment Utilized During Treatment Gait belt   Activity Tolerance Patient tolerated treatment well;Patient limited by fatigue   Behavior During Therapy [facility] for tasks assessed/performed;Anxious      Past Medical History:  Diagnosis Date  . Anemia    hx in her 30's  . Anxiety   . Depression   . GERD (gastroesophageal reflux disease)   . Hemorrhoids   . Hypertension   . Neuromuscular disorder (HCC)   . Parkinson disease (HCC) 2014  . PONV (postoperative nausea and vomiting)   . Pre-diabetes   . Sleep apnea    Uses a C-Pap machine  . Vertigo     Past Surgical History:  Procedure Laterality Date  . ABDOMINAL HYSTERECTOMY    . COLONOSCOPY  2010  . COLONOSCOPY WITH PROPOFOL N/A 02/01/2016   Procedure: COLONOSCOPY WITH PROPOFOL;  Surgeon: Earline Mayotte, MD;  Location: [facility] ENDOSCOPY;  Service: Endoscopy;  Laterality: N/A;  . HEMORRHOID BANDING N/A 02/01/2016   Procedure: [place];  Surgeon: Earline Mayotte, MD;  Location: [facility] ENDOSCOPY;  Service: Endoscopy;  Laterality: N/A;  . REVERSE SHOULDER ARTHROPLASTY Right 10/09/2016   Procedure: REVERSE SHOULDER ARTHROPLASTY;  Surgeon: Christena Flake, MD;  Location: [facility] ORS;  Service: Orthopedics;  Laterality: Right;  . SHOULDER CLOSED REDUCTION Right 09/28/2016   Procedure: CLOSED REDUCTION SHOULDER;  Surgeon: Christena Flake, MD;  Location: [facility] ORS;  Service: Orthopedics;   Laterality: Right;    There were no vitals filed for this visit.      Subjective Assessment - 03/11/17 1511    Subjective Patient reports having difficulty with sit to stand, scooting chair to table, geting on and off her coat and sweater, lifting her legs into the car, going up and down a curb and hand coordination activities.    Patient is accompained by: Family member   Pertinent History Patient was diagnosed with parkinsons 2015, she just started taking medicine for parkinsons disease 2 months ago and her tremors improved in her LE's.  she had a right shoulder replacement in may 2018. She was having a lot of difficulty with staning and balance.  she is not able to read due to not able to focus her eyes. She had 2 falls in the last 6 months.    Limitations Lifting;Standing;Walking;House hold activities;Reading;Writing   How long can you stand comfortably? 10 minutes   How long can you walk comfortably? 10 minutes   Patient Stated Goals to be able to be better with her walking and not fall.   Currently in Pain? No/denies   Multiple Pain Sites No      Patient seen for LSVT Daily Session Maximal Daily Exercises for facilitation/coordination of movement Sustained movements are designed to rescale the amplitude of movement output for generalization to daily functional activities .Performed as follows for 1 set of 10 repetitions each multidirectional sustained movements  1) Floor to ceiling  2) Side to side multidirectional Repetitive movements performed in standing and are designed to provide retraining effort needed for sustained muscle activation in tasks   3) Step and reach forward  4) Step and reach backwards  5) Step and reach sideways  6) Rock and reach forward/backward  7) Rock and reach sideways Functional tasks: 1.Sit to stand functional component task with supervision 5 reps  2. simulated activities for: stepping up on a curb including tapping alternating feet and 10 x 2, and  stepping up to a 6 inch step x 10 , 3. sitting on a chair at a table and leaning fwd to lift her bottom up to simulate the first step to scoot the chair fwd x 10 4. Discussed task for putting on and off coat and sweater ; not performed due to time constraints 5. Discussed tasks for improving hand coordination for buttons and writing activities; not performed today due to time constraints   Pt showed decreased stability and fatigue with balance tasks.                          PT Education - 03/11/17 1512    Education provided Yes   Education Details plan of care and correct sit to stand sequencing   Person(s) Educated Patient   Methods Explanation;Demonstration   Comprehension Verbalized understanding;Returned demonstration          PT Short Term Goals - 03/05/17 1541      PT SHORT TERM GOAL #1   Title Patient (> 71 years old) will complete five times sit to stand test in < 15 seconds indicating an increased LE strength and improved balance.   Baseline 1.18 sec with use of LUE to push up   Time 2   Period Weeks   Status New   Target Date 03/21/17     PT SHORT TERM GOAL #2   Title Patient will be independent in home exercise program to improve strength/mobility for better functional independence with ADLs.   Baseline no HEP   Time 2   Period Weeks   Status New   Target Date 03/21/17           PT Long Term Goals - 03/05/17 1539      PT LONG TERM GOAL #1   Title Patient will reduce timed up and go to <11 seconds to reduce fall risk and demonstrate improved transfer/gait ability.   Baseline 17.43 sec with UE to push up   Time 4   Period Weeks   Status New   Target Date 04/08/17     PT LONG TERM GOAL #2   Title Patient will increase 10 meter walk test to >1.3m/s as to improve gait speed for better community ambulation and to reduce fall risk.   Baseline . 76 m/sec    Time 4   Period Weeks   Status New   Target Date 04/08/17     PT LONG TERM  GOAL #3   Title Patient will increase six minute walk test distance to >1000 for progression to community ambulator and improve gait ability   Baseline 930    Time 4   Period Weeks   Status New   Target Date 04/08/17     PT LONG TERM GOAL #4   Title Pateint will improve the 9 peg hole test by 6 seconds to indicate improved function to be able to perform tasks with improved  hand coordination.    Baseline 36.96 L ;  32.73 right   Time 4   Period Weeks   Status New   Target Date 04/08/17               Plan - 03/11/17 1514    Clinical Impression Statement Max cueing needed to appropriately perform LSVT tasks with leg, hand, and head position. Decreased coordination demonstrated requiring consistent verbal cueing to correct form. Cognitive understanding of task was delayed. Patient continues to demonstrate some in coordination of movement with select exercises such as rock and reach and stepping backwards. Patient responds well to verbal and tactile cues to correct form and technique.  CGA to SBA for safety with activities.  Cues to increase intensity and amplitude of movements throughout session   Rehab Potential Good   PT Frequency 4x / week   PT Duration 4 weeks   PT Treatment/Interventions Gait training;Functional mobility training;Stair training;Balance training;Therapeutic exercise;Therapeutic activities;Neuromuscular re-education;Patient/family education   PT Next Visit Plan LSVT BIG   PT Home Exercise Plan LSVT BIG   Consulted and Agree with Plan of Care Patient;Family member/caregiver      Patient will benefit from skilled therapeutic intervention in order to improve the following deficits and impairments:  Abnormal gait, Decreased balance, Decreased endurance, Decreased mobility, Difficulty walking, Pain, Impaired UE functional use, Impaired flexibility, Decreased strength, Decreased coordination, Decreased activity tolerance, Decreased cognition, Dizziness  Visit  Diagnosis: Difficulty in walking, not elsewhere classified  Muscle weakness (generalized)     Problem List Patient Active Problem List   Diagnosis Date Noted  . 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 Internal hemorrhoids 03/01/2016  . Acute anxiety 11/22/2014  . 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 Depression 11/22/2014  . Sore throat 11/16/2014    Ezekiel Ina, PT DPT 03/11/2017, 3:17 PM  [place] [facility] MAIN [facility] SERVICES [address] [place], [place], [address] Phone: [phone]   Fax:  [phone]  Name: WESTYN KEATLEY MRN: [id number] Date of Birth: 17-Nov-1945

## 2017-03-12 ENCOUNTER — Encounter: Payer: Self-pay | Admitting: Physical Therapy

## 2017-03-12 ENCOUNTER — Ambulatory Visit: Payer: Medicare Other | Admitting: Physical Therapy

## 2017-03-12 DIAGNOSIS — R262 Difficulty in walking, not elsewhere classified: Secondary | ICD-10-CM

## 2017-03-12 DIAGNOSIS — M6281 Muscle weakness (generalized): Secondary | ICD-10-CM

## 2017-03-12 NOTE — Therapy (Signed)
[facility] MAIN [facility] SERVICES [address] [place], [place], [address] Phone: [phone]   Fax:  [phone]  Physical Therapy Treatment  Patient Details  Name: Lynn Bauer MRN: [id number] Date of Birth: 07-Dec-1945 Referring Provider: mark Hyacinth Meeker  Encounter Date: 03/12/2017      PT End of Session - 03/12/17 1555    Visit Number 3   Number of Visits 17   Date for PT Re-Evaluation 04/08/17   Authorization Type medicare 3/10   PT Start Time 0200   PT Stop Time 0300   PT Time Calculation (min) 60 min   Equipment Utilized During Treatment Gait belt   Activity Tolerance Patient tolerated treatment well;Patient limited by fatigue   Behavior During Therapy [facility] for tasks assessed/performed;Anxious      Past Medical History:  Diagnosis Date  . Anemia    hx in her 30's  . Anxiety   . Depression   . GERD (gastroesophageal reflux disease)   . Hemorrhoids   . Hypertension   . Neuromuscular disorder (HCC)   . Parkinson disease (HCC) 2014  . PONV (postoperative nausea and vomiting)   . Pre-diabetes   . Sleep apnea    Uses a C-Pap machine  . Vertigo     Past Surgical History:  Procedure Laterality Date  . ABDOMINAL HYSTERECTOMY    . COLONOSCOPY  2010  . COLONOSCOPY WITH PROPOFOL N/A 02/01/2016   Procedure: COLONOSCOPY WITH PROPOFOL;  Surgeon: Earline Mayotte, MD;  Location: [facility] ENDOSCOPY;  Service: Endoscopy;  Laterality: N/A;  . HEMORRHOID BANDING N/A 02/01/2016   Procedure: [place];  Surgeon: Earline Mayotte, MD;  Location: [facility] ENDOSCOPY;  Service: Endoscopy;  Laterality: N/A;  . REVERSE SHOULDER ARTHROPLASTY Right 10/09/2016   Procedure: REVERSE SHOULDER ARTHROPLASTY;  Surgeon: Christena Flake, MD;  Location: [facility] ORS;  Service: Orthopedics;  Laterality: Right;  . SHOULDER CLOSED REDUCTION Right 09/28/2016   Procedure: CLOSED REDUCTION SHOULDER;  Surgeon: Christena Flake, MD;  Location: [facility] ORS;  Service: Orthopedics;   Laterality: Right;    There were no vitals filed for this visit.      Subjective Assessment - 03/12/17 1547    Subjective Patient reports that she did her exercises and is getting help from her husband.    Patient is accompained by: Family member   Pertinent History Patient was diagnosed with parkinsons 2015, she just started taking medicine for parkinsons disease 2 months ago and her tremors improved in her LE's.  she had a right shoulder replacement in may 2018. She was having a lot of difficulty with staning and balance.  she is not able to read due to not able to focus her eyes. She had 2 falls in the last 6 months.    Limitations Lifting;Standing;Walking;House hold activities;Reading;Writing   How long can you stand comfortably? 10 minutes   How long can you walk comfortably? 10 minutes   Patient Stated Goals to be able to be better with her walking and not fall.   Currently in Pain? No/denies   Multiple Pain Sites No    Patient seen for LSVT Daily Session Maximal Daily Exercises for facilitation/coordination of movement Sustained movements are designed to rescale the amplitude of movement output for generalization to daily functional activities .Performed as follows for 1 set of 10 repetitions each multidirectional sustained movements  1) Floor to ceiling  2) Side to side multidirectional Repetitive movements performed in standing and are designed to provide retraining effort needed  for sustained muscle activation in tasks  3) Step and reach forward  4) Step and reach backwards  5) Step and reach sideways  6) Rock and reach forward/backward  7) Rock and reach sideways Functional tasks: 1.Sit to stand functional component task with supervision 10 reps  2. simulated activities for: stepping up on a curb including tapping alternating feet and 10 x 2, and stepping up to a 6 inch step x 10 , 3. sitting on a chair at a table and leaning fwd to lift her bottom up to simulate the first  step to scoot the chair fwd x 10, scooting down a mat x 5 repetitios 4.. putting on and off a sweater with max assist x 5  5. tasks for improving hand coordination for buttons and writing activities including dealing cards and alternating front of the deck and back of the deck; Pt showed decreased stability and fatigue with balance tasks.   [place] BIG with max VC for big swing and multi tasking with remembering 3 grocery store items that begin with an A and 3 items that begin with a B                             PT Education - 03/12/17 1549    Education provided Yes   Education Details HEP LSVT BIG   Person(s) Educated Patient   Methods Explanation;Demonstration   Comprehension Verbalized understanding;Returned demonstration          PT Short Term Goals - 03/05/17 1541      PT SHORT TERM GOAL #1   Title Patient (> 105 years old) will complete five times sit to stand test in < 15 seconds indicating an increased LE strength and improved balance.   Baseline 1.18 sec with use of LUE to push up   Time 2   Period Weeks   Status New   Target Date 03/21/17     PT SHORT TERM GOAL #2   Title Patient will be independent in home exercise program to improve strength/mobility for better functional independence with ADLs.   Baseline no HEP   Time 2   Period Weeks   Status New   Target Date 03/21/17           PT Long Term Goals - 03/05/17 1539      PT LONG TERM GOAL #1   Title Patient will reduce timed up and go to <11 seconds to reduce fall risk and demonstrate improved transfer/gait ability.   Baseline 17.43 sec with UE to push up   Time 4   Period Weeks   Status New   Target Date 04/08/17     PT LONG TERM GOAL #2   Title Patient will increase 10 meter walk test to >1.33m/s as to improve gait speed for better community ambulation and to reduce fall risk.   Baseline . 76 m/sec    Time 4   Period Weeks   Status New   Target Date 04/08/17     PT LONG  TERM GOAL #3   Title Patient will increase six minute walk test distance to >1000 for progression to community ambulator and improve gait ability   Baseline 930    Time 4   Period Weeks   Status New   Target Date 04/08/17     PT LONG TERM GOAL #4   Title Pateint will improve the 9 peg hole test by 6 seconds to  indicate improved function to be able to perform tasks with improved hand coordination.    Baseline 36.96 L ;  32.73 right   Time 4   Period Weeks   Status New   Target Date 04/08/17               Plan - 03/12/17 1556    Clinical Impression Statement Continues to have balance deficits typical with diagnosis. Patient performs intermediate level exercises without pain behaviors and needs verbal cuing for postural alignment and head positioning.Cuing is needed to stretch the leg out in seated side reaching.Fatigue with sit to stand but demonstrating more control    Rehab Potential Good   PT Frequency 4x / week   PT Duration 4 weeks   PT Treatment/Interventions Gait training;Functional mobility training;Stair training;Balance training;Therapeutic exercise;Therapeutic activities;Neuromuscular re-education;Patient/family education   PT Next Visit Plan LSVT BIG   PT Home Exercise Plan LSVT BIG   Consulted and Agree with Plan of Care Patient;Family member/caregiver      Patient will benefit from skilled therapeutic intervention in order to improve the following deficits and impairments:  Abnormal gait, Decreased balance, Decreased endurance, Decreased mobility, Difficulty walking, Pain, Impaired UE functional use, Impaired flexibility, Decreased strength, Decreased coordination, Decreased activity tolerance, Decreased cognition, Dizziness  Visit Diagnosis: Difficulty in walking, not elsewhere classified  Muscle weakness (generalized)     Problem List Patient Active Problem List   Diagnosis Date Noted  . 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 Internal hemorrhoids 03/01/2016  . Acute anxiety 11/22/2014  . 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 Depression 11/22/2014  . Sore throat 11/16/2014    Ezekiel Ina, PT DPT 03/12/2017, 4:01 PM  [facility] MAIN [facility] SERVICES [address] [place], [place], [address] Phone: [phone]   Fax:  [phone]  Name: Lynn Bauer MRN: [id number] Date of Birth: 01/02/1946

## 2017-03-13 ENCOUNTER — Encounter: Payer: Self-pay | Admitting: Physical Therapy

## 2017-03-13 ENCOUNTER — Ambulatory Visit: Payer: Medicare Other | Admitting: Physical Therapy

## 2017-03-13 DIAGNOSIS — R262 Difficulty in walking, not elsewhere classified: Secondary | ICD-10-CM | POA: Diagnosis not present

## 2017-03-13 DIAGNOSIS — M6281 Muscle weakness (generalized): Secondary | ICD-10-CM

## 2017-03-13 NOTE — Therapy (Signed)
[facility] MA[facility] [address] [place], [place], [address] Phone: [phone]   Fax:  [phone]  Physical Therapy Treatment  Patient Details  Name: Lynn Bauer MRN: [id number] Date of Birth: 03/03/1946 Referring Provider: mark Hyacinth Meekermiller  Encounter Date: 03/13/2017      PT End of Session - 03/13/17 1658    Visit Number 4   Number of Visits 17   Date for PT Re-Evaluation 04/08/17   Authorization Type medicare 4/10   PT Start Time 0200   PT Stop Time 0300   PT Time Calculation (min) 60 min   Equipment Utilized During Treatment Gait belt   Activity Tolerance Patient tolerated treatment well;Patient limited by fatigue   Behavior [facility] Therapy [facility] for tasks assessed/performed;Anxious      Past Medical History:  Diagnosis Date  . Anemia    hx in her 30's  . Anxiety   . Depression   . GERD (gastroesophageal reflux disease)   . Hemorrhoids   . Hypertension   . Neuromuscular disorder (HCC)   . Parkinson disease (HCC) 2014  . PONV (postoperative nausea and vomiting)   . Pre-diabetes   . Sleep apnea    Uses a C-Pap machine  . Vertigo     Past Surgical History:  Procedure Laterality Date  . ABDOMINAL HYSTERECTOMY    . COLONOSCOPY  2010  . COLONOSCOPY WITH PROPOFOL N/A 02/01/2016   Procedure: COLONOSCOPY WITH PROPOFOL;  Surgeon: Earline MayotteJeffrey W Byrnett, MD;  [facility];  Service: Endoscopy;  Laterality: N/A;  . HEMORRHOID BANDING N/A 02/01/2016   Procedure: [place] LionsHEMORRHOID BANDING;  Surgeon: Earline MayotteJeffrey W Byrnett, MD;  [facility];  Service: Endoscopy;  Laterality: N/A;  . REVERSE SHOULDER ARTHROPLASTY Right 10/09/2016   Procedure: REVERSE SHOULDER ARTHROPLASTY;  Surgeon: Christena FlakePoggi, John J, MD;  Location: [facility] ORS;  Service: Orthopedics;  Laterality: Right;  . SHOULDER CLOSED REDUCTION Right 09/28/2016   Procedure: CLOSED REDUCTION SHOULDER;  Surgeon: Christena FlakePoggi, John J, MD;  Location: [facility] ORS;  Service: Orthopedics;   Laterality: Right;    There were no vitals filed for this visit.      Subjective Assessment - 03/13/17 1657    Subjective Patient reports that she did her exercises and is getting help from her husband. She is nervous about her balance and falling while doing her exercises.   Patient is accompained by: Family member   Pertinent History Patient was diagnosed with parkinsons 2015, she just started taking medicine for parkinsons disease 2 months ago and her tremors improved in her LE's.  she had a right shoulder replacement in may 2018. She was having a lot of difficulty with staning and balance.  she is not able to read due to not able to focus her eyes. She had 2 falls in the last 6 months.    Limitations Lifting;Standing;Walking;House hold activities;Reading;Writing   How long can you stand comfortably? 10 minutes   How long can you walk comfortably? 10 minutes   Patient Stated Goals to be able to be better with her walking and not fall.   Currently in Pain? No/denies   Multiple Pain Sites No      Patient seen for LSVT Daily Session Maximal Daily Exercises for facilitation/coordination of movement Sustained movements are designed to rescale the amplitude of movement output for generalization to daily functional activities .Performed as follows for 1 set of 10 repetitions each multidirectional sustained movements  1) Floor to ceiling  2) Side to side multidirectional  Repetitive movements performed in standing and are designed to provide retraining effort needed for sustained muscle activation in tasks  3) Step and reach forward  4) Step and reach backwards  5) Step and reach sideways 6) Rock and reach forward/backward  7) Rock and reach sideways Functional tasks: 1.Sit to stand functional component task with supervision 10 reps  2.simulated activities for: stepping up on a curb including tapping alternating feet and 10 x 2, and stepping up to a 6 inch step x 10 , 3. sitting on a chair  at a table and leaning fwd to lift her bottom up to simulate the first step to scoot the chair fwd x 10, scooting down a mat x 5 repetitios 4.. putting on and off a sweater with max assist x 5  5. tasks for improving hand coordination for buttons and writing activities including dealing cards and alternating front of the deck and back of the deck; Pt showed decreased stability and fatigue with balance tasks.                            PT Education - 03/13/17 1658    Education provided Yes   Education Details balance and saftey tips   Person(s) Educated Patient   Methods Explanation;Demonstration   Comprehension Verbalized understanding;Returned demonstration          PT Short Term Goals - 03/05/17 1541      PT SHORT TERM GOAL #1   Title Patient (> 72 years old) will complete five times sit to stand test in < 15 seconds indicating an increased LE strength and improved balance.   Baseline 1.18 sec with use of LUE to push up   Time 2   Period Weeks   Status New   Target Date 03/21/17     PT SHORT TERM GOAL #2   Title Patient will be independent in home exercise program to improve strength/mobility for better functional independence with ADLs.   Baseline no HEP   Time 2   Period Weeks   Status New   Target Date 03/21/17           PT Long Term Goals - 03/05/17 1539      PT LONG TERM GOAL #1   Title Patient will reduce timed up and go to <11 seconds to reduce fall risk and demonstrate improved transfer/gait ability.   Baseline 17.43 sec with UE to push up   Time 4   Period Weeks   Status New   Target Date 04/08/17     PT LONG TERM GOAL #2   Title Patient will increase 10 meter walk test to >1.30m/s as to improve gait speed for better community ambulation and to reduce fall risk.   Baseline . 76 m/sec    Time 4   Period Weeks   Status New   Target Date 04/08/17     PT LONG TERM GOAL #3   Title Patient will increase six minute walk test  distance to >1000 for progression to community ambulator and improve gait ability   Baseline 930    Time 4   Period Weeks   Status New   Target Date 04/08/17     PT LONG TERM GOAL #4   Title Pateint will improve the 9 peg hole test by 6 seconds to indicate improved function to be able to perform tasks with improved hand coordination.    Baseline 36.96 L ;  32.73 right   Time 4   Period Weeks   Status New   Target Date 04/08/17               Plan - 03/13/17 1659    Clinical Impression Statement Patient continues to demonstrates less incoordination of movement with select exercises such as rock and reach and stepping backwards. Patient responds well to verbal and tactile cues to correct form and technique. Patient is able to catch mistakes in technique with incorrect positions and is able remember the start and finish positions. Motor control of LE much improved.  Muscle fatigue but no major pain complaints.   Rehab Potential Good   PT Frequency 4x / week   PT Duration 4 weeks   PT Treatment/Interventions Gait training;Functional mobility training;Stair training;Balance training;Therapeutic exercise;Therapeutic activities;Neuromuscular re-education;Patient/family education   PT Next Visit Plan LSVT BIG   PT Home Exercise Plan LSVT BIG   Consulted and Agree with Plan of Care Patient;Family member/caregiver      Patient will benefit from skilled therapeutic intervention in order to improve the following deficits and impairments:  Abnormal gait, Decreased balance, Decreased endurance, Decreased mobility, Difficulty walking, Pain, Impaired UE functional use, Impaired flexibility, Decreased strength, Decreased coordination, Decreased activity tolerance, Decreased cognition, Dizziness  Visit Diagnosis: Difficulty in walking, not elsewhere classified  Muscle weakness (generalized)     Problem List Patient Active Problem List   Diagnosis Date Noted  . Status post reverse total  shoulder replacement, right 10/09/2016  . Internal hemorrhoids 03/01/2016  . Acute anxiety 11/22/2014  . 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 Depression 11/22/2014  . Sore throat 11/16/2014    Ezekiel Ina, [place], DPT 03/13/2017, 5:03 PM  [facility] MAIN [facility] SERVICES [address] [place], [place], [address] Phone: [phone]   Fax:  [phone]  Name: Lynn Bauer MRN: [id number] Date of Birth: 05-Sep-1945

## 2017-03-14 ENCOUNTER — Ambulatory Visit: Payer: Medicare Other | Admitting: Physical Therapy

## 2017-03-14 ENCOUNTER — Encounter: Payer: Self-pay | Admitting: Physical Therapy

## 2017-03-14 DIAGNOSIS — M6281 Muscle weakness (generalized): Secondary | ICD-10-CM

## 2017-03-14 DIAGNOSIS — R262 Difficulty in walking, not elsewhere classified: Secondary | ICD-10-CM

## 2017-03-14 NOTE — Therapy (Signed)
[facility] MAIN [facility] SERVICES [address] [place], [place], [address] Phone: [phone]   Fax:  [phone]  Physical Therapy Treatment  Patient Details  Name: Lynn Bauer MRN: [id number] Date of Birth: 09-12-1945 Referring Provider: mark Hyacinth Meeker  Encounter Date: 03/14/2017      PT End of Session - 03/14/17 1414    Visit Number 5   Number of Visits 17   Date for PT Re-Evaluation 04/08/17   Authorization Type medicare 5/10   PT Start Time 0200   PT Stop Time 0300   PT Time Calculation (min) 60 min   Equipment Utilized During Treatment Gait belt   Activity Tolerance Patient tolerated treatment well;Patient limited by fatigue   Behavior During Therapy [facility] for tasks assessed/performed;Anxious      Past Medical History:  Diagnosis Date  . Anemia    hx in her 30's  . Anxiety   . Depression   . GERD (gastroesophageal reflux disease)   . Hemorrhoids   . Hypertension   . Neuromuscular disorder (HCC)   . Parkinson disease (HCC) 2014  . PONV (postoperative nausea and vomiting)   . Pre-diabetes   . Sleep apnea    Uses a C-Pap machine  . Vertigo     Past Surgical History:  Procedure Laterality Date  . ABDOMINAL HYSTERECTOMY    . COLONOSCOPY  2010  . COLONOSCOPY WITH PROPOFOL N/A 02/01/2016   Procedure: COLONOSCOPY WITH PROPOFOL;  Surgeon: Earline Mayotte, MD;  Location: [facility] ENDOSCOPY;  Service: Endoscopy;  Laterality: N/A;  . HEMORRHOID BANDING N/A 02/01/2016   Procedure:  Lions;  Surgeon: Earline Mayotte, MD;  Location: [facility] ENDOSCOPY;  Service: Endoscopy;  Laterality: N/A;  . REVERSE SHOULDER ARTHROPLASTY Right 10/09/2016   Procedure: REVERSE SHOULDER ARTHROPLASTY;  Surgeon: Christena Flake, MD;  Location: [facility] ORS;  Service: Orthopedics;  Laterality: Right;  . SHOULDER CLOSED REDUCTION Right 09/28/2016   Procedure: CLOSED REDUCTION SHOULDER;  Surgeon: Christena Flake, MD;  Location: [facility] ORS;  Service: Orthopedics;   Laterality: Right;    There were no vitals filed for this visit.      Subjective Assessment - 03/14/17 1411    Subjective Patient reports that she did her exercises and is getting help from her husband. She is nervous about her balance and falling while doing her exercises. She is having trouble with the backwards stepping exercise.   Patient is accompained by: Family member   Pertinent History Patient was diagnosed with parkinsons 2015, she just started taking medicine for parkinsons disease 2 months ago and her tremors improved in her LE's.  she had a right shoulder replacement in may 2018. She was having a lot of difficulty with staning and balance.  she is not able to read due to not able to focus her eyes. She had 2 falls in the last 6 months.    Limitations Lifting;Standing;Walking;House hold activities;Reading;Writing   How long can you stand comfortably? 10 minutes   How long can you walk comfortably? 10 minutes   Patient Stated Goals to be able to be better with her walking and not fall.   Currently in Pain? Yes   Pain Score 4    Pain Location Arm   Pain Orientation Right   Pain Descriptors / Indicators Aching   Pain Type Chronic pain   Pain Onset More than a month ago   Pain Frequency Intermittent   Multiple Pain Sites No       Patient  seen for LSVT Daily Session Maximal Daily Exercises for facilitation/coordination of movement Sustained movements are designed to rescale the amplitude of movement output for generalization to daily functional activities .Performed as follows for 1 set of 10 repetitions each multidirectional sustained movements  1) Floor to ceiling  2) Side to side multidirectional Repetitive movements performed in standing and are designed to provide retraining effort needed for sustained muscle activation in tasks  3) Step and reach forward  4) Step and reach backwards  5) Step and reach sideways 6) Rock and reach forward/backward  7) Rock and reach  sideways Functional tasks: 1.Sit to stand functional component task with supervision 10reps  2.simulated activities for: stepping up on a curb including tapping alternating feet and 10 x 2, and stepping up to a 6 inch step x 10 , 3. sitting on a chair at a table and leaning fwd to lift her bottom up to simulate the first step to scoot the chair fwd x 10, scooting down a mat x 5 repetitions 4.. putting on and off asweater with max assist x 5  5. tasks for improving hand coordination for buttons and writing activities including pushing coins into the coin counter;  Gait training with VC for big arm swing 1000 feet and cognitive tasks remembering 4 items that begin with"A"  Apples, apricot, anchovies, artichoke and 4 items that begin with "B", 2 items that begin with "C" cantaloupe, carrots  Pt showed decreased stability and fatigue with balance tasks.                           PT Education - 03/14/17 1412    Education provided Yes   Education Details correct form for exercises   Person(s) Educated Patient;Spouse   Methods Explanation;Demonstration   Comprehension Verbalized understanding;Returned demonstration          PT Short Term Goals - 03/05/17 1541      PT SHORT TERM GOAL #1   Title Patient (> 71 years old) will complete five times sit to stand test in < 15 seconds indicating an increased LE strength and improved balance.   Baseline 1.18 sec with use of LUE to push up   Time 2   Period Weeks   Status New   Target Date 03/21/17     PT SHORT TERM GOAL #2   Title Patient will be independent in home exercise program to improve strength/mobility for better functional independence with ADLs.   Baseline no HEP   Time 2   Period Weeks   Status New   Target Date 03/21/17           PT Long Term Goals - 03/05/17 1539      PT LONG TERM GOAL #1   Title Patient will reduce timed up and go to <11 seconds to reduce fall risk and demonstrate improved  transfer/gait ability.   Baseline 17.43 sec with UE to push up   Time 4   Period Weeks   Status New   Target Date 04/08/17     PT LONG TERM GOAL #2   Title Patient will increase 10 meter walk test to >1.72m/s as to improve gait speed for better community ambulation and to reduce fall risk.   Baseline . 76 m/sec    Time 4   Period Weeks   Status New   Target Date 04/08/17     PT LONG TERM GOAL #3   Title Patient will  increase six minute walk test distance to >1000 for progression to community ambulator and improve gait ability   Baseline 930    Time 4   Period Weeks   Status New   Target Date 04/08/17     PT LONG TERM GOAL #4   Title Pateint will improve the 9 peg hole test by 6 seconds to indicate improved function to be able to perform tasks with improved hand coordination.    Baseline 36.96 L ;  32.73 right   Time 4   Period Weeks   Status New   Target Date 04/08/17               Plan - 03/14/17 1424    Clinical Impression Statement Patient has difficulty with turning  head and rotating  trunk with weight shifting exercises. Patient needs cues to step back to start position each time with step fwd exercise and to finish big with each end position. . Patient has loss of balance and needs UE support occassionally thorough out exercise. Patient has slowness of movement during rotation and beginning movements.   Rehab Potential Good   PT Frequency 4x / week   PT Duration 4 weeks   PT Treatment/Interventions Gait training;Functional mobility training;Stair training;Balance training;Therapeutic exercise;Therapeutic activities;Neuromuscular re-education;Patient/family education   PT Next Visit Plan LSVT BIG   PT Home Exercise Plan LSVT BIG   Consulted and Agree with Plan of Care Patient;Family member/caregiver      Patient will benefit from skilled therapeutic intervention in order to improve the following deficits and impairments:  Abnormal gait, Decreased balance,  Decreased endurance, Decreased mobility, Difficulty walking, Pain, Impaired UE functional use, Impaired flexibility, Decreased strength, Decreased coordination, Decreased activity tolerance, Decreased cognition, Dizziness  Visit Diagnosis: Difficulty in walking, not elsewhere classified  Muscle weakness (generalized)     Problem List Patient Active Problem List   Diagnosis Date Noted  . 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 Internal hemorrhoids 03/01/2016  . Acute anxiety 11/22/2014  . 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 Depression 11/22/2014  . Sore throat 11/16/2014    Ezekiel InaMansfield, Damiya Sandefur S, PT DPT 03/14/2017, 2:26 PM  [place] [facility] [address] [place], [place], [address] Phone: [phone]   Fax:  [phone]  Name: Lynn Bauer MRN: [id number] Date of Birth: 02/13/1946

## 2017-03-18 ENCOUNTER — Encounter: Payer: Self-pay | Admitting: Physical Therapy

## 2017-03-18 ENCOUNTER — Ambulatory Visit: Payer: Medicare Other | Admitting: Physical Therapy

## 2017-03-18 DIAGNOSIS — R262 Difficulty in walking, not elsewhere classified: Secondary | ICD-10-CM | POA: Diagnosis not present

## 2017-03-18 DIAGNOSIS — M6281 Muscle weakness (generalized): Secondary | ICD-10-CM

## 2017-03-18 NOTE — Therapy (Signed)
[facility] [address] [place], [place], [address] Phone: [phone]   Fax:  [phone]  Physical Therapy Treatment  Patient Details  Name: Lynn Bauer P [place] MRN: [id number] Date of Birth: 03/08/1946 Referring Provider: mark Hyacinth Meekermiller  Encounter Date: 03/18/2017      PT End of Session - 03/18/17 1404    Visit Number 6   Number of Visits 17   Date for PT Re-Evaluation 04/08/17   Authorization Type medicare 6/10   PT Start Time 1357   PT Stop Time 1510   PT Time Calculation (min) 73 min   Equipment Utilized During Treatment Gait belt   Activity Tolerance Patient tolerated treatment well   Behavior During Therapy [facility] for tasks assessed/performed      Past Medical History:  Diagnosis Date  . Anemia    hx in her 30's  . Anxiety   . Depression   . GERD (gastroesophageal reflux disease)   . Hemorrhoids   . Hypertension   . Neuromuscular disorder (HCC)   . Parkinson disease (HCC) 2014  . PONV (postoperative nausea and vomiting)   . Pre-diabetes   . Sleep apnea    Uses a C-Pap machine  . Vertigo     Past Surgical History:  Procedure Laterality Date  . ABDOMINAL HYSTERECTOMY    . COLONOSCOPY  2010  . COLONOSCOPY WITH PROPOFOL N/A 02/01/2016   Procedure: COLONOSCOPY WITH PROPOFOL;  Surgeon: Earline MayotteJeffrey W Byrnett, MD;  [facility];  Service: Endoscopy;  Laterality: N/A;  . HEMORRHOID BANDING N/A 02/01/2016   Procedure: [place] LionsHEMORRHOID BANDING;  Surgeon: Earline MayotteJeffrey W Byrnett, MD;  [facility];  Service: Endoscopy;  Laterality: N/A;  . REVERSE SHOULDER ARTHROPLASTY Right 10/09/2016   Procedure: REVERSE SHOULDER ARTHROPLASTY;  Surgeon: Christena FlakePoggi, John J, MD;  Location: [facility] ORS;  Service: Orthopedics;  Laterality: Right;  . SHOULDER CLOSED REDUCTION Right 09/28/2016   Procedure: CLOSED REDUCTION SHOULDER;  Surgeon: Christena FlakePoggi, John J, MD;  Location: [facility] ORS;  Service: Orthopedics;  Laterality: Right;    There were no  vitals filed for this visit.      Subjective Assessment - 03/18/17 1404    Subjective Patient reports she has had a bad weekend and that she has not been doing the exercises every day. HEP reinforced. Patient states she laid too long in bed and that her right upper arm is sore today. In addition, patient states she has been having some dizziness today. Patient states she wants to try therapy today. Encouragement provided.    Patient is accompained by: Family member   Pertinent History Patient was diagnosed with parkinsons 2015, she just started taking medicine for parkinsons disease 2 months ago and her tremors improved in her LE's.  she had a right shoulder replacement in may 2018. She was having a lot of difficulty with staning and balance.  she is not able to read due to not able to focus her eyes. She had 2 falls in the last 6 months.    Limitations Lifting;Standing;Walking;House hold activities;Reading;Writing   How long can you stand comfortably? 10 minutes   How long can you walk comfortably? 10 minutes   Patient Stated Goals to be able to be better with her walking and not fall.   Currently in Pain? Yes   Pain Location Shoulder   Pain Orientation Right   Pain Type Chronic pain   Pain Onset More than a month ago      LSVT: Patient seen for  LSVT Daily Session Maximal Daily Exercises for facilitation/coordination of movement Maximum Sustained Movements are designed to rescale the amplitude of movement output for generalization to daily functional activities. Performed as follows for 1 set of 10 repetitions each multi-directional sustained movements: 1) Floor to ceiling  2) Side to side multidirectional  Repetitive movements performed in standing and are designed to provide retraining effort needed for sustained muscle activation in tasks. Performed as follows for 1 set of 10 repetitions each of multi-directional repetitive movements: 3) Step and reach forward step  4) Step and reach  sideways step   5) Step and reach backwards step 6) Rock and reach forward/backward  7) Rock and reach sideways   Functional Component Task: 1. Sit to stand BIG functional component task with supervision 5 reps  2. Alternating step taps 1 set of 10reps standing on firm ground with step tapping to a 6" step and 1 set of 10 reps while standing on purple foam pad while performing alternating step taps to 6" wooden step in // bars with CGA.  3. Step up over and return on 6" wooden step in // bars with CGA without UEs supports except patient did reach for support several times briefly for balance.  4. Worked on scooting a chair forward at a simulated table. Patient required cuing to scoot forward, tuck feet, reach forward and push with legs to come to partial standing to get her balance and then to reach back to pull chair forward. Patient able to perform 6 reps with cuing.  5. Worked on peg board coordination of left hand.  Note: patient forgot to bring her sweater/jacket so could not work on donning/doffing jacket or buttons this date. Patient plans on bringing jacket to next session.  Patient given daily carryover task to complete. Patient to practice scooting her chair forward at dinner tonight.   Ambulation:  Worked on ambulating with BIG movements focusing on increasing step length and arm swing movements while doing naming tasks such as naming states and colors. Patient needed reminder cues for arm swing. Patient ambulated 500' with SBA.       PT Short Term Goals - 03/05/17 1541      PT SHORT TERM GOAL #1   Title Patient (> 55 years old) will complete five times sit to stand test in < 15 seconds indicating an increased LE strength and improved balance.   Baseline 1.18 sec with use of LUE to push up   Time 2   Period Weeks   Status New   Target Date 03/21/17     PT SHORT TERM GOAL #2   Title Patient will be independent in home exercise program to improve strength/mobility for better  functional independence with ADLs.   Baseline no HEP   Time 2   Period Weeks   Status New   Target Date 03/21/17           PT Long Term Goals - 03/05/17 1539      PT LONG TERM GOAL #1   Title Patient will reduce timed up and go to <11 seconds to reduce fall risk and demonstrate improved transfer/gait ability.   Baseline 17.43 sec with UE to push up   Time 4   Period Weeks   Status New   Target Date 04/08/17     PT LONG TERM GOAL #2   Title Patient will increase 10 meter walk test to >1.64m/s as to improve gait speed for better community ambulation and to reduce  fall risk.   Baseline . 76 m/sec    Time 4   Period Weeks   Status New   Target Date 04/08/17     PT LONG TERM GOAL #3   Title Patient will increase six minute walk test distance to >1000 for progression to community ambulator and improve gait ability   Baseline 930    Time 4   Period Weeks   Status New   Target Date 04/08/17     PT LONG TERM GOAL #4   Title Pateint will improve the 9 peg hole test by 6 seconds to indicate improved function to be able to perform tasks with improved hand coordination.    Baseline 36.96 L ;  32.73 right   Time 4   Period Weeks   Status New   Target Date 04/08/17               Plan - 03/18/17 1404    Clinical Impression Statement Patient arrived to clinic reporting mild dizziness and imbalance this date. Patient with decreased right shoulder AROM. Patient continues to require modeling cues for LSVT exercises. Patient unsteady at times during session and needed CGA to Min A during exercises. Patient had increased difficulty with rock and reach sideways as she has difficulty rotating at the waist, supinating forearms and with pivoting on her feet. Patient motivated to session and improved with practice during the repetitive movements and functional component tasks. Patient challenged by standingn on compliant surface. Reinforced importance of performing HEP daily. Encouraged  patient to continue to follow-up as indicated.    Rehab Potential Good   PT Frequency 4x / week   PT Duration 4 weeks   PT Treatment/Interventions Gait training;Functional mobility training;Stair training;Balance training;Therapeutic exercise;Therapeutic activities;Neuromuscular re-education;Patient/family education   PT Next Visit Plan LSVT BIG- work on handwriting for functional component task, continue to work on standing on foam while doing alternating step taps   PT Home Exercise Plan LSVT BIG   Consulted and Agree with Plan of Care Patient;Family member/caregiver      Patient will benefit from skilled therapeutic intervention in order to improve the following deficits and impairments:  Abnormal gait, Decreased balance, Decreased endurance, Decreased mobility, Difficulty walking, Pain, Impaired UE functional use, Impaired flexibility, Decreased strength, Decreased coordination, Decreased activity tolerance, Decreased cognition, Dizziness  Visit Diagnosis: Difficulty in walking, not elsewhere classified  Muscle weakness (generalized)     Problem List Patient Active Problem List   Diagnosis Date Noted  . 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 Internal hemorrhoids 03/01/2016  . Acute anxiety 11/22/2014  . 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 Depression 11/22/2014  . Sore throat 11/16/2014    Murphy,Dorriea 03/18/2017, 4:12 PM  [facility] MAIN [facility] SERVICES [address] [place], [place], [address] Phone: [phone]   Fax:  [phone]  Name: CATALEA LABRECQUE MRN: [id number] Date of Birth: September 22, 1945

## 2017-03-19 ENCOUNTER — Ambulatory Visit: Payer: Medicare Other | Admitting: Physical Therapy

## 2017-03-19 ENCOUNTER — Encounter: Payer: Self-pay | Admitting: Physical Therapy

## 2017-03-19 DIAGNOSIS — M6281 Muscle weakness (generalized): Secondary | ICD-10-CM

## 2017-03-19 DIAGNOSIS — R262 Difficulty in walking, not elsewhere classified: Secondary | ICD-10-CM | POA: Diagnosis not present

## 2017-03-19 NOTE — Therapy (Signed)
[place] MAIN [facility] SERVICES [address] [place], [place], [address] Phone: [phone]   Fax:  [phone]  Physical Therapy Treatment  Patient Details  Name: Lynn Bauer MRN: [id number] Date of Birth: 12-26-1945 Referring Provider: mark Hyacinth Meeker  Encounter Date: 03/19/2017      PT End of Session - 03/19/17 1408    Visit Number 7   Number of Visits 17   Date for PT Re-Evaluation 04/08/17   Authorization Type medicare 7/10   PT Start Time 1400   PT Stop Time 1505   PT Time Calculation (min) 65 min   Equipment Utilized During Treatment Gait belt   Activity Tolerance Patient tolerated treatment well   Behavior During Therapy [facility] for tasks assessed/performed      Past Medical History:  Diagnosis Date  . Anemia    hx in her 30's  . Anxiety   . Depression   . GERD (gastroesophageal reflux disease)   . Hemorrhoids   . Hypertension   . Neuromuscular disorder (HCC)   . Parkinson disease (HCC) 2014  . PONV (postoperative nausea and vomiting)   . Pre-diabetes   . Sleep apnea    Uses a C-Pap machine  . Vertigo     Past Surgical History:  Procedure Laterality Date  . ABDOMINAL HYSTERECTOMY    . COLONOSCOPY  2010  . COLONOSCOPY WITH PROPOFOL N/A 02/01/2016   Procedure: COLONOSCOPY WITH PROPOFOL;  Surgeon: Earline Mayotte, MD;  Location: [facility] ENDOSCOPY;  Service: Endoscopy;  Laterality: N/A;  . HEMORRHOID BANDING N/A 02/01/2016   Procedure: [place];  Surgeon: Earline Mayotte, MD;  Location: [facility] ENDOSCOPY;  Service: Endoscopy;  Laterality: N/A;  . REVERSE SHOULDER ARTHROPLASTY Right 10/09/2016   Procedure: REVERSE SHOULDER ARTHROPLASTY;  Surgeon: Christena Flake, MD;  Location: [facility] ORS;  Service: Orthopedics;  Laterality: Right;  . SHOULDER CLOSED REDUCTION Right 09/28/2016   Procedure: CLOSED REDUCTION SHOULDER;  Surgeon: Christena Flake, MD;  Location: [facility] ORS;  Service: Orthopedics;  Laterality: Right;    There were no  vitals filed for this visit.      Subjective Assessment - 03/19/17 1406    Subjective Patient reports that she started having dizziness mid-morning. Patient states she has dizziness on and off for years. Patient states she did her LSVT exercises yesterday and did a chair scoot.    Patient is accompained by: Family member   Pertinent History Patient was diagnosed with parkinsons 2015, she just started taking medicine for parkinsons disease 2 months ago and her tremors improved in her LE's.  she had a right shoulder replacement in may 2018. She was having a lot of difficulty with staning and balance.  she is not able to read due to not able to focus her eyes. She had 2 falls in the last 6 months.    Limitations Lifting;Standing;Walking;House hold activities;Reading;Writing   How long can you stand comfortably? 10 minutes   How long can you walk comfortably? 10 minutes   Patient Stated Goals to be able to be better with her walking and not fall.   Pain Onset More than a month ago       LSVT: Patient seen for LSVT Daily Session Maximal Daily Exercises for facilitation/coordination of movement Maximum Sustained Movements are designed to rescale the amplitude of movement output for generalization to daily functional activities. Performed as follows for 1 set of 10 repetitions each multi-directional sustained movements: 1) Floor to ceiling  2) Side to  side multidirectional  Repetitive movements performed in standing and are designed to provide retraining effort needed for sustained muscle activation in tasks. Performed as follows for 1 set of 10 repetitions each of multi-directional repetitive movements: 3) Step and reach forward step  4) Step and reach sideways step   5) Step and reach backwards step 6) Rock and reach forward/backward  7) Rock and reach sideways   Functional Component Task: 1. Sit to stand BIG functional component task with supervision 5 reps  2. Alternating step taps 2 set  of 10 reps standing on Airex pad with step tapping to a 6" step  3. Step up over and return on 6" wooden step in // bars - to be performed next visit secondary to time constraints this date. 4. Worked on scooting a heavier metal chair forward on carpeted surface at a table. Patient required cuing to scoot forward, tuck feet, reach forward and push with legs to come to partial standing to get her balance and then to reach back to pull chair forward. Patient had a difficult time with progression of being a heavier chair on carpet. Will continue to work on this activity.  5. Worked on buttoning multiple reps. Utilized finger flicks and discussed strategies  Note: patient forgot to bring her sweater/jacket so could not work on donning/doffing jacket or buttons this date. Patient plans on bringing jacket to next session.  Patient given daily carryover task to complete. Patient to practice handwriting two words of patient's choice 10 times tonight using "BIG" font.   Ambulation:  Worked on ambulating with BIG movements focusing on increasing step length and arm swing movements while doing A, B, C naming tasks such as naming fruits. Patient needed reminder cues for arm swing especially when added dual tasks to walking. Patient ambulated 500' with SBA.        PT Education - 03/19/17 1408    Education provided Yes   Education Details reviewed LSVT exercises, discussed backward step exercise as patient and her husband state that this is the most difficult one for pt   Person(s) Educated Patient;Spouse   Methods Explanation;Demonstration;Tactile cues;Verbal cues   Comprehension Verbalized understanding;Returned demonstration;Tactile cues required          PT Short Term Goals - 03/05/17 1541      PT SHORT TERM GOAL #1   Title Patient (> 41 years old) will complete five times sit to stand test in < 15 seconds indicating an increased LE strength and improved balance.   Baseline 1.18 sec with use of  LUE to push up   Time 2   Period Weeks   Status New   Target Date 03/21/17     PT SHORT TERM GOAL #2   Title Patient will be independent in home exercise program to improve strength/mobility for better functional independence with ADLs.   Baseline no HEP   Time 2   Period Weeks   Status New   Target Date 03/21/17           PT Long Term Goals - 03/05/17 1539      PT LONG TERM GOAL #1   Title Patient will reduce timed up and go to <11 seconds to reduce fall risk and demonstrate improved transfer/gait ability.   Baseline 17.43 sec with UE to push up   Time 4   Period Weeks   Status New   Target Date 04/08/17     PT LONG TERM GOAL #2   Title Patient  will increase 10 meter walk test to >1.4160m/s as to improve gait speed for better community ambulation and to reduce fall risk.   Baseline . 76 m/sec    Time 4   Period Weeks   Status New   Target Date 04/08/17     PT LONG TERM GOAL #3   Title Patient will increase six minute walk test distance to >1000 for progression to community ambulator and improve gait ability   Baseline 930    Time 4   Period Weeks   Status New   Target Date 04/08/17     PT LONG TERM GOAL #4   Title Pateint will improve the 9 peg hole test by 6 seconds to indicate improved function to be able to perform tasks with improved hand coordination.    Baseline 36.96 L ;  32.73 right   Time 4   Period Weeks   Status New   Target Date 04/08/17               Plan - 03/20/17 1228    Clinical Impression Statement Patient reports that she practice all of her exercises and daily carry-over task last night. Patient states that a few of the exercises are still challenging and reviewed all exercises with patient and provided strategies and cuing for the exercises that the patient was struggling with at home. Patient demonstrated improvements in endurance with the exercises this date as she needed only 2 rest breaks during session. In addition, patient  demonstrated improvements in being able to take bigger steps and with trunk rotation. Patient continues to have difficulty with rock and reach sideways and step and reach bacwards step exercises, but improves with practice during session. Encouraged patient to continue to follow up as indicated.     Rehab Potential Good   PT Frequency 4x / week   PT Duration 4 weeks   PT Treatment/Interventions Gait training;Functional mobility training;Stair training;Balance training;Therapeutic exercise;Therapeutic activities;Neuromuscular re-education;Patient/family education   PT Next Visit Plan LSVT BIG- work on handwriting for functional component task, continue to work on standing on foam while doing alternating step taps   PT Home Exercise Plan LSVT BIG   Consulted and Agree with Plan of Care Patient;Family member/caregiver      Patient will benefit from skilled therapeutic intervention in order to improve the following deficits and impairments:  Abnormal gait, Decreased balance, Decreased endurance, Decreased mobility, Difficulty walking, Pain, Impaired UE functional use, Impaired flexibility, Decreased strength, Decreased coordination, Decreased activity tolerance, Decreased cognition, Dizziness  Visit Diagnosis: Difficulty in walking, not elsewhere classified  Muscle weakness (generalized)     Problem List Patient Active Problem List   Diagnosis Date Noted  . 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 Internal hemorrhoids 03/01/2016  . Acute anxiety 11/22/2014  . 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 Depression 11/22/2014  . Sore throat 11/16/2014   Mardelle Matteorriea Murphy PT, DPT [phone] Mardelle MatteMurphy,Dorriea 03/20/2017, 12:37 PM  [place] [facility] MAIN [facility] SERVICES [address] [place], [place], [address] Phone: [phone]   Fax:  [phone]  Name: Olivia Mackieoni P Imperato MRN: [id number] Date of Birth: 08/05/1945

## 2017-03-20 ENCOUNTER — Ambulatory Visit: Payer: Medicare Other | Admitting: Physical Therapy

## 2017-03-20 ENCOUNTER — Encounter: Payer: Self-pay | Admitting: Physical Therapy

## 2017-03-20 DIAGNOSIS — M6281 Muscle weakness (generalized): Secondary | ICD-10-CM

## 2017-03-20 DIAGNOSIS — R262 Difficulty in walking, not elsewhere classified: Secondary | ICD-10-CM

## 2017-03-20 NOTE — Therapy (Signed)
[facility] [facility] MAIN [facility] SERVICES [address] [place], [place], [address] Phone: [phone]   Fax:  [phone]  Physical Therapy Treatment  Patient Details  Name: Lynn Bauer MRN: [id number] Date of Birth: June 10, 1945 Referring Provider: mark Hyacinth Meeker  Encounter Date: 03/20/2017      PT End of Session - 03/20/17 1414    Visit Number 8   Number of Visits 17   Date for PT Re-Evaluation 04/08/17   Authorization Type medicare 8/10   PT Start Time 1403   PT Stop Time 1511   PT Time Calculation (min) 68 min   Equipment Utilized During Treatment Gait belt   Activity Tolerance Patient tolerated treatment well   Behavior During Therapy [facility] for tasks assessed/performed      Past Medical History:  Diagnosis Date  . Anemia    hx in her 30's  . Anxiety   . Depression   . GERD (gastroesophageal reflux disease)   . Hemorrhoids   . Hypertension   . Neuromuscular disorder (HCC)   . Parkinson disease (HCC) 2014  . PONV (postoperative nausea and vomiting)   . Pre-diabetes   . Sleep apnea    Uses a C-Pap machine  . Vertigo     Past Surgical History:  Procedure Laterality Date  . ABDOMINAL HYSTERECTOMY    . COLONOSCOPY  2010  . COLONOSCOPY WITH PROPOFOL N/A 02/01/2016   Procedure: COLONOSCOPY WITH PROPOFOL;  Surgeon: Earline Mayotte, MD;  Location: [facility] ENDOSCOPY;  Service: Endoscopy;  Laterality: N/A;  . HEMORRHOID BANDING N/A 02/01/2016   Procedure: [place];  Surgeon: Earline Mayotte, MD;  Location: [facility] ENDOSCOPY;  Service: Endoscopy;  Laterality: N/A;  . REVERSE SHOULDER ARTHROPLASTY Right 10/09/2016   Procedure: REVERSE SHOULDER ARTHROPLASTY;  Surgeon: Christena Flake, MD;  Location: [facility] ORS;  Service: Orthopedics;  Laterality: Right;  . SHOULDER CLOSED REDUCTION Right 09/28/2016   Procedure: CLOSED REDUCTION SHOULDER;  Surgeon: Christena Flake, MD;  Location: [facility] ORS;  Service: Orthopedics;  Laterality: Right;    There were no  vitals filed for this visit.      Subjective Assessment - 03/20/17 1412    Subjective Patient reports that she is not having any dizziness today.    Patient is accompained by: Family member   Pertinent History Patient was diagnosed with parkinsons 2015, she just started taking medicine for parkinsons disease 2 months ago and her tremors improved in her LE's.  she had a right shoulder replacement in may 2018. She was having a lot of difficulty with staning and balance.  she is not able to read due to not able to focus her eyes. She had 2 falls in the last 6 months.    Limitations Lifting;Standing;Walking;House hold activities;Reading;Writing   How long can you stand comfortably? 10 minutes   How long can you walk comfortably? 10 minutes   Patient Stated Goals to be able to be better with her walking and not fall.   Pain Score 2    Pain Location Shoulder   Pain Orientation Right   Pain Type Chronic pain   Pain Onset More than a month ago        LSVT: Patient seen for LSVT Daily Session Maximal Daily Exercises for facilitation/coordination of movement Maximum Sustained Movements are designed to rescale the amplitude of movement output for generalization to daily functional activities. Performed as follows for 1 set of 10 repetitions each multi-directional sustained movements: 1) Floor to ceiling  2)  Side to side multidirectional  Repetitive movements performed in standing and are designed to provide retraining effort needed for sustained muscle activation in tasks. Performed as follows for 1 set of 10 repetitions each of multi-directional repetitive movements: 3) Step and reach forward step  4) Step and reach sideways step   5) Step and reach backwards step 6) Rock and reach forward/backward  7) Rock and reach sideways    Functional Component Task: 1. Sit to stand BIG functional component task with supervision 10 reps  2. Alternating step taps 1set of 10 reps standing on firm  surfacing while doing alternating foot taps onto soccer ball with CGA. Patient had two small losses of balance requiring min A to correct.  3. Standing on Airex pad worked on stepping up onto and back off a 6" wooden step 6 reps in // bars with CGA/ Min A. 4. Worked on scooting a heavier metal chair forward on carpeted surface at a table. Patient required cuing to scoot forward, tuck feet, reach forward and push with legs to come to partial standing to get her balance and then to reach back to pull chair forward. Patient had a difficult time with progression of being a heavier chair on carpet. Will continue to work on this activity.  5. Worked on donning coat 5 reps. Note: patient forgot to bring her a shirt/jacket with buttons to be able to work on buttoning so did donning the jacket instead.  6. Worked on [employer] handwriting with emphasis on speed while maintaining the BIG penmanship by writing 2 words 7 times in a row.  Utilized finger flicks   Patient given daily carryover taskto complete. Patient to practice handwriting two words of patient's choice 10 times using "BIG" font and with good speed.   Ambulation:  Worked on ambulating with BIG movements focusing on increasing step length and arm swing movements while doing A, B, C naming tasks such as naming girl's names and also did naming states. Patient needed reminder cues for arm swing especially when added dual tasks to walking. Patient ambulated 500' with SBA.         PT Education - 03/20/17 1413    Education provided Yes   Education Details reviewed LSVT, provided with handout with key words written on the backward step exercise    Person(s) Educated Patient;Spouse   Methods Explanation;Demonstration;Handout   Comprehension Verbalized understanding          PT Short Term Goals - 03/05/17 1541      PT SHORT TERM GOAL #1   Title Patient (> 71 years old) will complete five times sit to stand test in < 15 seconds indicating an  increased LE strength and improved balance.   Baseline 1.18 sec with use of LUE to push up   Time 2   Period Weeks   Status New   Target Date 03/21/17     PT SHORT TERM GOAL #2   Title Patient will be independent in home exercise program to improve strength/mobility for better functional independence with ADLs.   Baseline no HEP   Time 2   Period Weeks   Status New   Target Date 03/21/17           PT Long Term Goals - 03/05/17 1539      PT LONG TERM GOAL #1   Title Patient will reduce timed up and go to <11 seconds to reduce fall risk and demonstrate improved transfer/gait ability.   Baseline 17.43 sec  with UE to push up   Time 4   Period Weeks   Status New   Target Date 04/08/17     PT LONG TERM GOAL #2   Title Patient will increase 10 meter walk test to >1.31m/s as to improve gait speed for better community ambulation and to reduce fall risk.   Baseline . 76 m/sec    Time 4   Period Weeks   Status New   Target Date 04/08/17     PT LONG TERM GOAL #3   Title Patient will increase six minute walk test distance to >1000 for progression to community ambulator and improve gait ability   Baseline 930    Time 4   Period Weeks   Status New   Target Date 04/08/17     PT LONG TERM GOAL #4   Title Pateint will improve the 9 peg hole test by 6 seconds to indicate improved function to be able to perform tasks with improved hand coordination.    Baseline 36.96 L ;  32.73 right   Time 4   Period Weeks   Status New   Target Date 04/08/17               Plan - 03/20/17 1516    Clinical Impression Statement Patient beginning to require reduced cuing with elements of the daily LSVT exercises. However, she does need cuing with step and reach bacwards step exercise as well as the rock and reach exercises. Patient did well with the handwriting tasks today and with her homework as she was able to maintain the size of the font as well as clarity of the letters. Patient would  benefit from continued PT services to work on independence with performing the LSVT exercises without manual and verbal cuing.    Rehab Potential Good   PT Frequency 4x / week   PT Duration 4 weeks   PT Treatment/Interventions Gait training;Functional mobility training;Stair training;Balance training;Therapeutic exercise;Therapeutic activities;Neuromuscular re-education;Patient/family education   PT Next Visit Plan LSVT BIG- work on handwriting for functional component task, continue to work on standing on foam while doing alternating step taps   PT Home Exercise Plan LSVT BIG   Consulted and Agree with Plan of Care Patient;Family member/caregiver      Patient will benefit from skilled therapeutic intervention in order to improve the following deficits and impairments:  Abnormal gait, Decreased balance, Decreased endurance, Decreased mobility, Difficulty walking, Pain, Impaired UE functional use, Impaired flexibility, Decreased strength, Decreased coordination, Decreased activity tolerance, Decreased cognition, Dizziness  Visit Diagnosis: Difficulty in walking, not elsewhere classified  Muscle weakness (generalized)     Problem List Patient Active Problem List   Diagnosis Date Noted  . 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 Internal hemorrhoids 03/01/2016  . Acute anxiety 11/22/2014  . 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 Depression 11/22/2014  . Sore throat 11/16/2014   Mardelle Matte PT, DPT [phone] Mardelle Matte 03/20/2017, 3:55 PM  [place] MAIN [facility] SERVICES [address] [place], [place], [address] Phone: [phone]   Fax:  [phone]  Name: Lynn Bauer MRN: [id number] Date of Birth: [birth year]

## 2017-03-22 ENCOUNTER — Ambulatory Visit: Payer: Medicare Other | Admitting: Physical Therapy

## 2017-03-22 ENCOUNTER — Encounter: Payer: Self-pay | Admitting: Physical Therapy

## 2017-03-22 DIAGNOSIS — R262 Difficulty in walking, not elsewhere classified: Secondary | ICD-10-CM

## 2017-03-22 DIAGNOSIS — M6281 Muscle weakness (generalized): Secondary | ICD-10-CM

## 2017-03-22 NOTE — Therapy (Signed)
[facility] MAIN [facility] SERVICES [address] [place], [place], [address] Phone: [phone]   Fax:  [phone]  Physical Therapy Treatment  Patient Details  Name: Lynn Bauer MRN: [id number] Date of Birth: 1946-05-18 Referring Provider: mark Hyacinth Meeker  Encounter Date: 03/22/2017      PT End of Session - 03/22/17 1258    Visit Number 9   Number of Visits 17   Date for PT Re-Evaluation 04/08/17   Authorization Type medicare 9/10   PT Start Time 1258   PT Stop Time 1405   PT Time Calculation (min) 67 min   Equipment Utilized During Treatment Gait belt   Activity Tolerance Patient tolerated treatment well   Behavior During Therapy [facility] for tasks assessed/performed      Past Medical History:  Diagnosis Date  . Anemia    hx in her 30's  . Anxiety   . Depression   . GERD (gastroesophageal reflux disease)   . Hemorrhoids   . Hypertension   . Neuromuscular disorder (HCC)   . Parkinson disease (HCC) 2014  . PONV (postoperative nausea and vomiting)   . Pre-diabetes   . Sleep apnea    Uses a C-Pap machine  . Vertigo     Past Surgical History:  Procedure Laterality Date  . ABDOMINAL HYSTERECTOMY    . COLONOSCOPY  2010  . COLONOSCOPY WITH PROPOFOL N/A 02/01/2016   Procedure: COLONOSCOPY WITH PROPOFOL;  Surgeon: Earline Mayotte, MD;  Location: [facility] ENDOSCOPY;  Service: Endoscopy;  Laterality: N/A;  . HEMORRHOID BANDING N/A 02/01/2016   Procedure: [place];  Surgeon: Earline Mayotte, MD;  Location: [facility] ENDOSCOPY;  Service: Endoscopy;  Laterality: N/A;  . REVERSE SHOULDER ARTHROPLASTY Right 10/09/2016   Procedure: REVERSE SHOULDER ARTHROPLASTY;  Surgeon: Christena Flake, MD;  Location: [facility] ORS;  Service: Orthopedics;  Laterality: Right;  . SHOULDER CLOSED REDUCTION Right 09/28/2016   Procedure: CLOSED REDUCTION SHOULDER;  Surgeon: Christena Flake, MD;  Location: [facility] ORS;  Service: Orthopedics;  Laterality: Right;    There were no  vitals filed for this visit.      Subjective Assessment - 03/22/17 1257    Subjective Patient states she did her exercises yesterday but the backward step is still difficult. Patient and her husband stated that they skipped doing the backwards step exercise because patient was too unsteady. (discussed and practice the adapted LSVT version of the backward step exercise and issued as a substitute for patient.)   Patient is accompained by: Family member   Pertinent History Patient was diagnosed with parkinsons 2015, she just started taking medicine for parkinsons disease 2 months ago and her tremors improved in her LE's.  she had a right shoulder replacement in may 2018. She was having a lot of difficulty with staning and balance.  she is not able to read due to not able to focus her eyes. She had 2 falls in the last 6 months.    Limitations Lifting;Standing;Walking;House hold activities;Reading;Writing   How long can you stand comfortably? 10 minutes   How long can you walk comfortably? 10 minutes   Patient Stated Goals to be able to be better with her walking and not fall.   Currently in Pain? Yes   Pain Score 4    Pain Location Shoulder   Pain Orientation Right   Pain Type Chronic pain   Pain Onset More than a month ago         LSVT: Patient seen for  LSVT Daily Session Maximal Daily Exercises for facilitation/coordination of movement Maximum Sustained Movements are designed to rescale the amplitude of movement output for generalization to daily functional activities. Performed as follows for 1 set of 10 repetitions each multi-directional sustained movements: 1) Floor to ceiling  2) Side to side multidirectional  Repetitive movements performed in standing and are designed to provide retraining effort needed for sustained muscle activation in tasks. Performed as follows for 1 set of 10 repetitions each of multi-directional repetitive movements: 3) Step and reach forward step  4) Step and  reach sideways step   5) Step and reach backwards step 6) Rock and reach forward/backward  7) Rock and reach sideways   Functional Component Task: 1. Sit to stand BIG functional component task with supervision 10 reps  2. Standing on [employer]ire[employer] pad worked on alternating step taps 15 reps each leg to cones with CGA in // bars without UEs support. Patient with a few small losses of balance. 3. Patient performed step up, onto, off and returns over a 6" wooden step 6 reps in // bars with CGA. 4. Worked on scooting a heavier metal chair forward on carpeted surface at a table. Patient required cuing to tuck feet and to reach forward to come to partial standing to get her balance and then to reach back to pull chair forward.  5. Note: patient did not bring a jacket or a shirt with buttons or zippers as she wore a sweatshirt so deferred this activity this date.  6. Worked on [employer] handwriting with emphasis on speed while maintaining the BIG penmanship by writing 2 words 7 times in a row.  Utilized finger flicks   Patient given daily carryover taskto complete. Patient to practice the adapted backwards step exercise.  Ambulation:  Worked on ambulating with BIG movements focusing on increasing step length and arm swing movements while doing number counting by serial 4s. Patient had too difficult a time with backwards counting from 100 by serial 4s so did forward counting from 0-100 by 4s.   Patient needed a few reminder cues for arm swing while walking. Patient ambulated 500' with S.        PT Education - 03/22/17 1258    Education provided Yes   Education Details reviewed LSVT exercises; discussed and practice the adapted LSVT version of the backward step exercise as patient continues to have difficulties with performing this exercise and issued the adapted backwards step as a substitute for patient.   Person(s) Educated Patient;Spouse   Methods Explanation;Demonstration;Handout;Tactile cues;Verbal cues    Comprehension Verbalized understanding;Returned demonstration;Verbal cues required          PT Short Term Goals - 03/05/17 1541      PT SHORT TERM GOAL #1   Title Patient (> 71 years old) will complete five times sit to stand test in < 15 seconds indicating an increased LE strength and improved balance.   Baseline 1.18 sec with use of LUE to push up   Time 2   Period Weeks   Status New   Target Date 03/21/17     PT SHORT TERM GOAL #2   Title Patient will be independent in home exercise program to improve strength/mobility for better functional independence with ADLs.   Baseline no HEP   Time 2   Period Weeks   Status New   Target Date 03/21/17           PT Long Term Goals - 03/05/17 1539  PT LONG TERM GOAL #1   Title Patient will reduce timed up and go to <11 seconds to reduce fall risk and demonstrate improved transfer/gait ability.   Baseline 17.43 sec with UE to push up   Time 4   Period Weeks   Status New   Target Date 04/08/17     PT LONG TERM GOAL #2   Title Patient will increase 10 meter walk test to >1.52m/s as to improve gait speed for better community ambulation and to reduce fall risk.   Baseline . 76 m/sec    Time 4   Period Weeks   Status New   Target Date 04/08/17     PT LONG TERM GOAL #3   Title Patient will increase six minute walk test distance to >1000 for progression to community ambulator and improve gait ability   Baseline 930    Time 4   Period Weeks   Status New   Target Date 04/08/17     PT LONG TERM GOAL #4   Title Pateint will improve the 9 peg hole test by 6 seconds to indicate improved function to be able to perform tasks with improved hand coordination.    Baseline 36.96 L ;  32.73 right   Time 4   Period Weeks   Status New   Target Date 04/08/17               Plan - 03/22/17 1258    Clinical Impression Statement Patient continued having difficulty with the step and reach backwards step exercise; therefore,  tried the adapted version of this exercise which uses a chair for added stability and patient did much better with being able to sequence and perform the exercise. Provided patient with the adapted version of this exercise for her HEP. Patient requiring decreased cuing as the week progressed and was having fewer losses of balance. In addition, patient able to demonstrate improved trunk rotation and stretching with activities during session. Patient able to work on progressions of the functional component tasks over the week. Encouraged patient and her husband to continue to perform LSVT HEP exercises and to follow-up as indicated.    Rehab Potential Good   PT Frequency 4x / week   PT Duration 4 weeks   PT Treatment/Interventions Gait training;Functional mobility training;Stair training;Balance training;Therapeutic exercise;Therapeutic activities;Neuromuscular re-education;Patient/family education   PT Next Visit Plan LSVT BIG- continue to work on standing on foam while doing alternating step taps progressions, consider adding foam pad under feet as progression for sit to stand exercises.    PT Home Exercise Plan LSVT BIG   Consulted and Agree with Plan of Care Patient;Family member/caregiver      Patient will benefit from skilled therapeutic intervention in order to improve the following deficits and impairments:  Abnormal gait, Decreased balance, Decreased endurance, Decreased mobility, Difficulty walking, Pain, Impaired UE functional use, Impaired flexibility, Decreased strength, Decreased coordination, Decreased activity tolerance, Decreased cognition, Dizziness  Visit Diagnosis: Difficulty in walking, not elsewhere classified  Muscle weakness (generalized)     Problem List Patient Active Problem List   Diagnosis Date Noted  . 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 Internal hemorrhoids 03/01/2016  . Acute anxiety 11/22/2014  . 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 Depression 11/22/2014  . Sore throat 11/16/2014   Mardelle Matte PT, DPT [phone] Mardelle Matte 03/22/2017, 2:24 PM  [facility] MAIN [facility] SERVICES [address] [place], [place], [address] Phone: [phone]  Fax:  [phone]  Name: WALLIS SPIZZIRRI MRN: [id number] Date of Birth: 16-Feb-1946

## 2017-03-25 ENCOUNTER — Encounter: Payer: Self-pay | Admitting: Physical Therapy

## 2017-03-25 ENCOUNTER — Ambulatory Visit: Payer: Medicare Other | Admitting: Physical Therapy

## 2017-03-25 DIAGNOSIS — R262 Difficulty in walking, not elsewhere classified: Secondary | ICD-10-CM | POA: Diagnosis not present

## 2017-03-25 DIAGNOSIS — M6281 Muscle weakness (generalized): Secondary | ICD-10-CM

## 2017-03-25 NOTE — Therapy (Signed)
[place] MAIN [facility] SERVICES [address] [place], [place], [address] Phone: [phone]   Fax:  [phone]  Physical Therapy Treatment  Patient Details  Name: Lynn Bauer MRN: [id number] Date of Birth: 04-18-1946 Referring Provider: mark Sabra Heck  Encounter Date: 03/25/2017      PT End of Session - 03/25/17 1417    Visit Number 10   Number of Visits 17   Date for PT Re-Evaluation 04/08/17   Authorization Type medicare 10/10   PT Start Time 0207   PT Stop Time 0302   PT Time Calculation (min) 55 min   Equipment Utilized During Treatment Gait belt   Activity Tolerance Patient tolerated treatment well   Behavior During Therapy [facility] for tasks assessed/performed      Past Medical History:  Diagnosis Date  . Anemia    hx in her 30's  . Anxiety   . Depression   . GERD (gastroesophageal reflux disease)   . Hemorrhoids   . Hypertension   . Neuromuscular disorder ([place])   . Parkinson disease ([place]) 2014  . PONV (postoperative nausea and vomiting)   . Pre-diabetes   . Sleep apnea    Uses a C-Pap machine  . Vertigo     Past Surgical History:  Procedure Laterality Date  . ABDOMINAL HYSTERECTOMY    . COLONOSCOPY  2010  . COLONOSCOPY WITH PROPOFOL N/A 02/01/2016   Procedure: COLONOSCOPY WITH PROPOFOL;  Surgeon: Robert Bellow, MD;  Location: [facility] ENDOSCOPY;  Service: Endoscopy;  Laterality: N/A;  . HEMORRHOID BANDING N/A 02/01/2016   Procedure: [place];  Surgeon: Robert Bellow, MD;  Location: [facility] ENDOSCOPY;  Service: Endoscopy;  Laterality: N/A;  . REVERSE SHOULDER ARTHROPLASTY Right 10/09/2016   Procedure: REVERSE SHOULDER ARTHROPLASTY;  Surgeon: Corky Mull, MD;  Location: [facility] ORS;  Service: Orthopedics;  Laterality: Right;  . SHOULDER CLOSED REDUCTION Right 09/28/2016   Procedure: CLOSED REDUCTION SHOULDER;  Surgeon: Corky Mull, MD;  Location: [facility] ORS;  Service: Orthopedics;  Laterality: Right;    There were no  vitals filed for this visit.      Subjective Assessment - 03/25/17 1415    Subjective Patient states she did her exercises yesterday but the backward step is continues to be  difficult. Patient and her husband stated that they are using the chair assist during the backward stepping.   Patient is accompained by: Family member   Pertinent History Patient was diagnosed with parkinsons 2015, she just started taking medicine for parkinsons disease 2 months ago and her tremors improved in her LE's.  she had a right shoulder replacement in may 2018. She was having a lot of difficulty with staning and balance.  she is not able to read due to not able to focus her eyes. She had 2 falls in the last 6 months.    Limitations Lifting;Standing;Walking;House hold activities;Reading;Writing   How long can you stand comfortably? 10 minutes   How long can you walk comfortably? 10 minutes   Patient Stated Goals to be able to be better with her walking and not fall.   Currently in Pain? No/denies   Pain Score 0-No pain   Pain Onset More than a month ago   Multiple Pain Sites No       OUTCOME MEASURES: TEST Outcome Interpretation  5 times sit<>stand 21 59  sec >60 yo, >15 sec indicates increased risk for falls  10 meter walk test    1.06  m/s <1.0 m/s indicates increased risk for falls; limited community ambulator  Timed up and Go    13.91           sec <14 sec indicates increased risk for falls  6 minute walk test    980        Feet 1000 feet is community Water quality scientist  <36/56 (100% risk for falls), 37-45 (80% risk for falls); 46-51 (>50% risk for falls); 52-55 (lower risk <25% of falls)  9 Hole Peg Test L:     29.85       R:54.58       Patient seen for LSVT Daily Session Maximal Daily Exercises for facilitation/coordination of movement Sustained movements are designed to rescale the amplitude of movement output for generalization to daily functional activities .Performed as  follows for 1 set of 10 repetitions each multidirectional sustained movements  1) Floor to ceiling  2) Side to side multidirectional Repetitive movements performed in standing and are designed to provide retraining effort needed for sustained muscle activation in tasks  3) Step and reach forward  4) Step and reach backwards  5) Step and reach sideways 6) Rock and reach forward/backward  7) Rock and reach sideways  Patient has difficulty with turning  head and rotating  trunk with weight shifting exercises                           PT Education - 03/25/17 1416    Education provided Yes   Education Details HEp LSVT BIG and use of chair for backward stepping   Person(s) Educated Patient   Methods Explanation;Demonstration;Verbal cues   Comprehension Verbalized understanding;Returned demonstration;Verbal cues required          PT Short Term Goals - 03/05/17 1541      PT SHORT TERM GOAL #1   Title Patient (> 62 years old) will complete five times sit to stand test in < 15 seconds indicating an increased LE strength and improved balance.   Baseline 1.18 sec with use of LUE to push up   Time 2   Period Weeks   Status New   Target Date 03/21/17     PT SHORT TERM GOAL #2   Title Patient will be independent in home exercise program to improve strength/mobility for better functional independence with ADLs.   Baseline no HEP   Time 2   Period Weeks   Status New   Target Date 03/21/17           PT Long Term Goals - 03/25/17 1556      PT LONG TERM GOAL #1   Title Patient will reduce timed up and go to <11 seconds to reduce fall risk and demonstrate improved transfer/gait ability.   Time 4   Period Weeks   Status Partially Met   Target Date 04/08/17     PT LONG TERM GOAL #2   Title Patient will increase 10 meter walk test to >1.36ms as to improve gait speed for better community ambulation and to reduce fall risk.   Baseline 1.091   Time 4   Period  Weeks   Status Partially Met   Target Date 04/08/17     PT LONG TERM GOAL #3   Title Patient will increase six minute walk test distance to >1000 for progression to community ambulator and improve gait ability   Baseline 980 feet   Time 4   Period  Weeks   Status Partially Met   Target Date 04/08/17     PT LONG TERM GOAL #4   Title Pateint will improve the 9 peg hole test by 6 seconds to indicate improved function to be able to perform tasks with improved hand coordination.    Time 4   Period Weeks   Status Partially Met   Target Date 04/08/17             Patient will benefit from skilled therapeutic intervention in order to improve the following deficits and impairments:     Visit Diagnosis: Difficulty in walking, not elsewhere classified  Muscle weakness (generalized)     Problem List Patient Active Problem List   Diagnosis Date Noted  . 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 Internal hemorrhoids 03/01/2016  . Acute anxiety 11/22/2014  . 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 Depression 11/22/2014  . Sore throat 11/16/2014    Alanson Puls, PT DPT 03/25/2017, 3:59 PM  [place] MAIN [facility] SERVICES [address] [place], [place], [address] Phone: [phone]   Fax:  [phone]  Name: AILANIE RUTTAN MRN: [id number] Date of Birth: 09/24/1945

## 2017-03-26 ENCOUNTER — Encounter: Payer: Self-pay | Admitting: Physical Therapy

## 2017-03-26 ENCOUNTER — Ambulatory Visit: Payer: Medicare Other | Admitting: Physical Therapy

## 2017-03-26 DIAGNOSIS — R262 Difficulty in walking, not elsewhere classified: Secondary | ICD-10-CM

## 2017-03-26 DIAGNOSIS — M6281 Muscle weakness (generalized): Secondary | ICD-10-CM

## 2017-03-26 NOTE — Therapy (Addendum)
[place] MAIN [facility] SERVICES [address] [place], [place], [address] Phone: [phone]   Fax:  [phone]  Physical Therapy Treatment  Patient Details  Name: Lynn Bauer MRN: [id number] Date of Birth: August 31, 1945 Referring Provider: mark Sabra Heck  Encounter Date: 03/26/2017      PT End of Session - 03/26/17 1409    Visit Number 11   Number of Visits 17   Date for PT Re-Evaluation 04/08/17   Authorization Type medicare 1/10   PT Start Time 0200   PT Stop Time 0300   PT Time Calculation (min) 60 min   Equipment Utilized During Treatment Gait belt   Activity Tolerance Patient tolerated treatment well   Behavior During Therapy [facility] for tasks assessed/performed      Past Medical History:  Diagnosis Date  . Anemia    hx in her 30's  . Anxiety   . Depression   . GERD (gastroesophageal reflux disease)   . Hemorrhoids   . Hypertension   . Neuromuscular disorder ([place])   . Parkinson disease ([place]) 2014  . PONV (postoperative nausea and vomiting)   . Pre-diabetes   . Sleep apnea    Uses a C-Pap machine  . Vertigo     Past Surgical History:  Procedure Laterality Date  . ABDOMINAL HYSTERECTOMY    . COLONOSCOPY  2010  . COLONOSCOPY WITH PROPOFOL N/A 02/01/2016   Procedure: COLONOSCOPY WITH PROPOFOL;  Surgeon: Robert Bellow, MD;  Location: [facility] ENDOSCOPY;  Service: Endoscopy;  Laterality: N/A;  . HEMORRHOID BANDING N/A 02/01/2016   Procedure: Thayer Jew;  Surgeon: Robert Bellow, MD;  Location: [facility] ENDOSCOPY;  Service: Endoscopy;  Laterality: N/A;  . REVERSE SHOULDER ARTHROPLASTY Right 10/09/2016   Procedure: REVERSE SHOULDER ARTHROPLASTY;  Surgeon: Corky Mull, MD;  Location: [facility] ORS;  Service: Orthopedics;  Laterality: Right;  . SHOULDER CLOSED REDUCTION Right 09/28/2016   Procedure: CLOSED REDUCTION SHOULDER;  Surgeon: Corky Mull, MD;  Location: [facility] ORS;  Service: Orthopedics;  Laterality: Right;    There were no  vitals filed for this visit.      Subjective Assessment - 03/26/17 1406    Subjective Patient says that her right shoulder is a little sore from using it punching holes in paper. Patient and her husband stated that they are using the chair assist during the backward stepping.   Patient is accompained by: Family member   Pertinent History Patient was diagnosed with parkinsons 2015, she just started taking medicine for parkinsons disease 2 months ago and her tremors improved in her LE's.  she had a right shoulder replacement in may 2018. She was having a lot of difficulty with staning and balance.  she is not able to read due to not able to focus her eyes. She had 2 falls in the last 6 months.    Limitations Lifting;Standing;Walking;House hold activities;Reading;Writing   How long can you stand comfortably? 10 minutes   How long can you walk comfortably? 10 minutes   Patient Stated Goals to be able to be better with her walking and not fall.   Currently in Pain? Yes   Pain Score 5    Pain Location Shoulder   Pain Orientation RIGHT   Pain Descriptors / Indicators Aching   Pain Type Chronic pain   Pain Onset More than a month ago   Pain Frequency Intermittent   Aggravating Factors  movement   Pain Relieving Factors na   Effect of Pain on  Daily Activities difficult to do her LSVT with RUE   Multiple Pain Sites No       Patient seen for LSVT Daily Session Maximal Daily Exercises for facilitation/coordination of movement Sustained movements are designed to rescale the amplitude of movement output for generalization to daily functional activities .Performed as follows for 1 set of 10 repetitions each multidirectional sustained movements  1) Floor to ceiling ( cues to remember to reach forward)  2) Side to side with cues to rotate forearm towards the ceiling 3) Step and reach forward  4) Step and reach backwards ( needs cues to bring back her arms, and lift her toes off the floor) 5) Step and  reach sideways; cues to rotate her head 6) Rock and reach forward/backward  7) Rock and reach sideways Functional tasks: 1.Sit to stand functional component task with supervision 10reps  2.simulated activities for: stepping up on a curb including tapping alternating feet and 10 x 2, and stepping up to a 6 inch step x 10 , 3. sitting on a chair at a table and leaning fwd to lift her bottom up to simulate the first step to scoot the chair fwd x 10, scooting down a mat x 5 repetitios 4.. putting on and off asweater  ( unable to perform due to forgetting her sweater )  5. tasks for improving hand coordination for getting pegs and putting them in a peg board. Pt showed decreased stability and fatigue with balance tasks.                            PT Education - 03/26/17 1408    Education provided Yes   Education Details HEP and use of ice for right shoulder   Person(s) Educated Patient   Methods Explanation;Demonstration;Verbal cues   Comprehension Verbalized understanding;Returned demonstration;Verbal cues required          PT Short Term Goals - 03/05/17 1541      PT SHORT TERM GOAL #1   Title Patient (> 50 years old) will complete five times sit to stand test in < 15 seconds indicating an increased LE strength and improved balance.   Baseline 1.18 sec with use of LUE to push up   Time 2   Period Weeks   Status New   Target Date 03/21/17     PT SHORT TERM GOAL #2   Title Patient will be independent in home exercise program to improve strength/mobility for better functional independence with ADLs.   Baseline no HEP   Time 2   Period Weeks   Status New   Target Date 03/21/17           PT Long Term Goals - 03/25/17 1556      PT LONG TERM GOAL #1   Title Patient will reduce timed up and go to <11 seconds to reduce fall risk and demonstrate improved transfer/gait ability.   Time 4   Period Weeks   Status Partially Met   Target Date 04/08/17      PT LONG TERM GOAL #2   Title Patient will increase 10 meter walk test to >1.33ms as to improve gait speed for better community ambulation and to reduce fall risk.   Baseline 1.091   Time 4   Period Weeks   Status Partially Met   Target Date 04/08/17     PT LONG TERM GOAL #3   Title Patient will increase six minute walk test  distance to >1000 for progression to community ambulator and improve gait ability   Baseline 980 feet   Time 4   Period Weeks   Status Partially Met   Target Date 04/08/17     PT LONG TERM GOAL #4   Title Pateint will improve the 9 peg hole test by 6 seconds to indicate improved function to be able to perform tasks with improved hand coordination.    Time 4   Period Weeks   Status Partially Met   Target Date 04/08/17               Plan - 03/26/17 1410    Clinical Impression Statement Patient continues to demonstrates less incoordination of movement with select exercises such as rock and reach and stepping backwards. Patient responds well to verbal and tactile cues to correct form and technique. Patient is able to catch mistakes in technique with incorrect positions and is able remember the start and finish positions. Motor control of LE much improved.  Muscle fatigue but no major pain complaints.   Rehab Potential Good   PT Frequency 4x / week   PT Duration 4 weeks   PT Treatment/Interventions Gait training;Functional mobility training;Stair training;Balance training;Therapeutic exercise;Therapeutic activities;Neuromuscular re-education;Patient/family education   PT Next Visit Plan LSVT BIG- continue to work on standing on foam while doing alternating step taps progressions, consider adding foam pad under feet as progression for sit to stand exercises.    PT Home Exercise Plan LSVT BIG   Consulted and Agree with Plan of Care Patient;Family member/caregiver      Patient will benefit from skilled therapeutic intervention in order to improve the  following deficits and impairments:  Abnormal gait, Decreased balance, Decreased endurance, Decreased mobility, Difficulty walking, Pain, Impaired UE functional use, Impaired flexibility, Decreased strength, Decreased coordination, Decreased activity tolerance, Decreased cognition, Dizziness  Visit Diagnosis: Difficulty in walking, not elsewhere classified  Muscle weakness (generalized)     Problem List Patient Active Problem List   Diagnosis Date Noted  . 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 Internal hemorrhoids 03/01/2016  . Acute anxiety 11/22/2014  . 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 Depression 11/22/2014  . Sore throat 11/16/2014    Alanson Puls, PT DPT 03/26/2017, 2:38 PM  [place] MAIN [facility] SERVICES [address] [place], [place], [address] Phone: [phone]   Fax:  [phone]  Name: MIYANNA WIERSMA MRN: [id number] Date of Birth: 05/01/1946

## 2017-03-27 ENCOUNTER — Ambulatory Visit: Payer: Medicare Other | Admitting: Physical Therapy

## 2017-03-27 ENCOUNTER — Encounter: Payer: Self-pay | Admitting: Physical Therapy

## 2017-03-27 DIAGNOSIS — R262 Difficulty in walking, not elsewhere classified: Secondary | ICD-10-CM | POA: Diagnosis not present

## 2017-03-27 DIAGNOSIS — M6281 Muscle weakness (generalized): Secondary | ICD-10-CM

## 2017-03-27 NOTE — Therapy (Signed)
[place] MAIN [facility] SERVICES [address] [place], [place], [address] Phone: [phone]   Fax:  [phone]  Physical Therapy Treatment  Patient Details  Name: Lynn Bauer MRN: [id number] Date of Birth: 09/14/1945 Referring Provider: mark Sabra Heck  Encounter Date: 03/27/2017      PT End of Session - 03/27/17 1409    Visit Number 12   Number of Visits 17   Date for PT Re-Evaluation 04/08/17   Authorization Type medicare 1/10   PT Start Time 0200   PT Stop Time 0300   PT Time Calculation (min) 60 min   Equipment Utilized During Treatment Gait belt   Activity Tolerance Patient tolerated treatment well   Behavior During Therapy [facility] for tasks assessed/performed      Past Medical History:  Diagnosis Date  . Anemia    hx in her 30's  . Anxiety   . Depression   . GERD (gastroesophageal reflux disease)   . Hemorrhoids   . Hypertension   . Neuromuscular disorder ([place])   . Parkinson disease ([place]) 2014  . PONV (postoperative nausea and vomiting)   . Pre-diabetes   . Sleep apnea    Uses a C-Pap machine  . Vertigo     Past Surgical History:  Procedure Laterality Date  . ABDOMINAL HYSTERECTOMY    . COLONOSCOPY  2010  . COLONOSCOPY WITH PROPOFOL N/A 02/01/2016   Procedure: COLONOSCOPY WITH PROPOFOL;  Surgeon: Robert Bellow, MD;  Location: [facility] ENDOSCOPY;  Service: Endoscopy;  Laterality: N/A;  . HEMORRHOID BANDING N/A 02/01/2016   Procedure: Thayer Jew;  Surgeon: Robert Bellow, MD;  Location: [facility] ENDOSCOPY;  Service: Endoscopy;  Laterality: N/A;  . REVERSE SHOULDER ARTHROPLASTY Right 10/09/2016   Procedure: REVERSE SHOULDER ARTHROPLASTY;  Surgeon: Corky Mull, MD;  Location: [facility] ORS;  Service: Orthopedics;  Laterality: Right;  . SHOULDER CLOSED REDUCTION Right 09/28/2016   Procedure: CLOSED REDUCTION SHOULDER;  Surgeon: Corky Mull, MD;  Location: [facility] ORS;  Service: Orthopedics;  Laterality: Right;    There were no  vitals filed for this visit.      Subjective Assessment - 03/27/17 1407    Subjective Patient says that her right shoulder is a little sore from using it punching holes in paper. Patient and her husband stated that they are using the chair assist during the backward stepping..   Patient is accompained by: Family member   Pertinent History Patient was diagnosed with parkinsons 2015, she just started taking medicine for parkinsons disease 2 months ago and her tremors improved in her LE's.  she had a right shoulder replacement in may 2018. She was having a lot of difficulty with staning and balance.  she is not able to read due to not able to focus her eyes. She had 2 falls in the last 6 months.    Limitations Lifting;Standing;Walking;House hold activities;Reading;Writing   How long can you stand comfortably? 10 minutes   How long can you walk comfortably? 10 minutes   Patient Stated Goals to be able to be better with her walking and not fall.   Currently in Pain? Yes   Pain Score 4    Pain Location Shoulder   Pain Orientation Left   Pain Descriptors / Indicators Aching   Pain Onset More than a month ago   Pain Frequency Intermittent   Aggravating Factors  movement   Pain Relieving Factors na   Effect of Pain on Daily Activities difficult to exercise  Multiple Pain Sites No        Patient seen for LSVT Daily Session Maximal Daily Exercises for facilitation/coordination of movement Sustained movements are designed to rescale the amplitude of movement output for generalization to daily functional activities .Performed as follows for 1 set of 10 repetitions each multidirectional sustained movements  1) Floor to ceiling  2) Side to side multidirectional Repetitive movements performed in standing and are designed to provide retraining effort needed for sustained muscle activation in tasks  3) Step and reach forward  4) Step and reach backwards  5) Step and reach sideways 6) Rock and reach  forward/backward  7) Rock and reach sideways Functional tasks: 1.Sit to stand functional component task with supervision 10 reps  2.simulated activities for: stepping up on a curb including tapping alternating feet and 10 x 2, and stepping up to a 6 inch step x 10 , 3. sitting on a chair at a table and leaning fwd to lift her bottom up to simulate the first step to scoot the chair fwd x 10, scooting down a mat x 5 repetitios 4.. putting on and off a sweater with max assist x 5  5. tasks for improving hand coordination for buttons and writing activities including dealing cards and alternating front of the deck and back of the deck; Pt showed decreased stability and fatigue with balance tasks.   [place] with max VC for big swing and multi tasking with remembering 3 grocery store items that begin with an A and 3 items that begin with a B                          PT Education - 03/27/17 1409    Education provided Yes   Education Details HEP LSVT   Person(s) Educated Patient   Methods Explanation;Demonstration;Tactile cues   Comprehension Verbalized understanding;Returned demonstration;Verbal cues required          PT Short Term Goals - 03/05/17 1541      PT SHORT TERM GOAL #1   Title Patient (> 71 years old) will complete five times sit to stand test in < 15 seconds indicating an increased LE strength and improved balance.   Baseline 1.18 sec with use of LUE to push up   Time 2   Period Weeks   Status New   Target Date 03/21/17     PT SHORT TERM GOAL #2   Title Patient will be independent in home exercise program to improve strength/mobility for better functional independence with ADLs.   Baseline no HEP   Time 2   Period Weeks   Status New   Target Date 03/21/17           PT Long Term Goals - 03/25/17 1556      PT LONG TERM GOAL #1   Title Patient will reduce timed up and go to <11 seconds to reduce fall risk and demonstrate improved  transfer/gait ability.   Time 4   Period Weeks   Status Partially Met   Target Date 04/08/17     PT LONG TERM GOAL #2   Title Patient will increase 10 meter walk test to >1.15ms as to improve gait speed for better community ambulation and to reduce fall risk.   Baseline 1.091   Time 4   Period Weeks   Status Partially Met   Target Date 04/08/17     PT LONG TERM GOAL #3   Title Patient  will increase six minute walk test distance to >1000 for progression to community ambulator and improve gait ability   Baseline 980 feet   Time 4   Period Weeks   Status Partially Met   Target Date 04/08/17     PT LONG TERM GOAL #4   Title Pateint will improve the 9 peg hole test by 6 seconds to indicate improved function to be able to perform tasks with improved hand coordination.    Time 4   Period Weeks   Status Partially Met   Target Date 04/08/17               Plan - 03/27/17 1409    Clinical Impression Statement Patient has difficulty with turning  head and rotating  trunk with weight shifting exercises. Patient has fatigue with standing exercises and needs constant VC to have correct posture. Patient has loss of balance and needs UE support intermittently thorough out exercise. Patient has slowness of movement during rotation and beginning movements.   Rehab Potential Good   PT Frequency 4x / week   PT Duration 4 weeks   PT Treatment/Interventions Gait training;Functional mobility training;Stair training;Balance training;Therapeutic exercise;Therapeutic activities;Neuromuscular re-education;Patient/family education   PT Next Visit Plan LSVT BIG- continue to work on standing on foam while doing alternating step taps progressions, consider adding foam pad under feet as progression for sit to stand exercises.    PT Home Exercise Plan LSVT BIG   Consulted and Agree with Plan of Care Patient;Family member/caregiver      Patient will benefit from skilled therapeutic intervention in  order to improve the following deficits and impairments:  Abnormal gait, Decreased balance, Decreased endurance, Decreased mobility, Difficulty walking, Pain, Impaired UE functional use, Impaired flexibility, Decreased strength, Decreased coordination, Decreased activity tolerance, Decreased cognition, Dizziness  Visit Diagnosis: Difficulty in walking, not elsewhere classified  Muscle weakness (generalized)     Problem List Patient Active Problem List   Diagnosis Date Noted  . 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 Internal hemorrhoids 03/01/2016  . Acute anxiety 11/22/2014  . 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 Depression 11/22/2014  . Sore throat 11/16/2014    Arelia Sneddon S. PT DPT 03/27/2017, 2:11 PM  [place] MAIN [facility] SERVICES [address] [place], [place], [address] Phone: [phone]   Fax:  [phone]  Name: MALANI LEES MRN: [id number] Date of Birth: [birth year]

## 2017-03-28 ENCOUNTER — Ambulatory Visit: Payer: Medicare Other | Attending: Internal Medicine | Admitting: Physical Therapy

## 2017-03-28 ENCOUNTER — Encounter: Payer: Self-pay | Admitting: Physical Therapy

## 2017-03-28 DIAGNOSIS — R208 Other disturbances of skin sensation: Secondary | ICD-10-CM | POA: Diagnosis present

## 2017-03-28 DIAGNOSIS — M25641 Stiffness of right hand, not elsewhere classified: Secondary | ICD-10-CM | POA: Diagnosis present

## 2017-03-28 DIAGNOSIS — R262 Difficulty in walking, not elsewhere classified: Secondary | ICD-10-CM | POA: Insufficient documentation

## 2017-03-28 DIAGNOSIS — M6281 Muscle weakness (generalized): Secondary | ICD-10-CM | POA: Diagnosis present

## 2017-03-28 NOTE — Therapy (Signed)
[place] MAIN [facility] SERVICES [address] [place], [place], [address] Phone: [phone]   Fax:  [phone]  Physical Therapy Treatment  Patient Details  Name: Lynn Bauer MRN: [id number] Date of Birth: 07/18/1945 Referring Provider: mark Sabra Heck  Encounter Date: 03/28/2017      PT End of Session - 03/28/17 2671    Visit Number 13   Number of Visits 17   Date for PT Re-Evaluation 04/08/17   Authorization Type medicare 3/10   PT Start Time 0200   PT Stop Time 0300   PT Time Calculation (min) 60 min   Equipment Utilized During Treatment Gait belt   Activity Tolerance Patient tolerated treatment well   Behavior During Therapy [facility] for tasks assessed/performed      Past Medical History:  Diagnosis Date  . Anemia    hx in her 30's  . Anxiety   . Depression   . GERD (gastroesophageal reflux disease)   . Hemorrhoids   . Hypertension   . Neuromuscular disorder ([place])   . Parkinson disease ([place]) 2014  . PONV (postoperative nausea and vomiting)   . Pre-diabetes   . Sleep apnea    Uses a C-Pap machine  . Vertigo     Past Surgical History:  Procedure Laterality Date  . ABDOMINAL HYSTERECTOMY    . COLONOSCOPY  2010  . COLONOSCOPY WITH PROPOFOL N/A 02/01/2016   Procedure: COLONOSCOPY WITH PROPOFOL;  Surgeon: Robert Bellow, MD;  Location: [facility] ENDOSCOPY;  Service: Endoscopy;  Laterality: N/A;  . HEMORRHOID BANDING N/A 02/01/2016   Procedure: Thayer Jew;  Surgeon: Robert Bellow, MD;  Location: [facility] ENDOSCOPY;  Service: Endoscopy;  Laterality: N/A;  . REVERSE SHOULDER ARTHROPLASTY Right 10/09/2016   Procedure: REVERSE SHOULDER ARTHROPLASTY;  Surgeon: Corky Mull, MD;  Location: [facility] ORS;  Service: Orthopedics;  Laterality: Right;  . SHOULDER CLOSED REDUCTION Right 09/28/2016   Procedure: CLOSED REDUCTION SHOULDER;  Surgeon: Corky Mull, MD;  Location: [facility] ORS;  Service: Orthopedics;  Laterality: Right;    There were no  vitals filed for this visit.      Subjective Assessment - 03/28/17 1404    Subjective Patient says that her right shoulder is still a little sore from using it punching holes in paper. Patient and her husband stated that they are using the chair assist during the backward stepping..   Patient is accompained by: Family member   Pertinent History Patient was diagnosed with parkinsons 2015, she just started taking medicine for parkinsons disease 2 months ago and her tremors improved in her LE's.  she had a right shoulder replacement in may 2018. She was having a lot of difficulty with staning and balance.  she is not able to read due to not able to focus her eyes. She had 2 falls in the last 6 months.    Limitations Lifting;Standing;Walking;House hold activities;Reading;Writing   How long can you stand comfortably? 10 minutes   How long can you walk comfortably? 10 minutes   Patient Stated Goals to be able to be better with her walking and not fall.   Currently in Pain? Yes   Pain Score 4    Pain Location Shoulder   Pain Orientation Left   Pain Descriptors / Indicators Aching   Pain Onset More than a month ago   Pain Frequency Intermittent   Multiple Pain Sites No       Patient seen for LSVT Daily Session Maximal Daily Exercises for facilitation/coordination  of movement Sustained movements are designed to rescale the amplitude of movement output for generalization to daily functional activities .Performed as follows for 1 set of 10 repetitions each multidirectional sustained movements  1) Floor to ceiling  2) Side to side multidirectional Repetitive movements performed in standing and are designed to provide retraining effort needed for sustained muscle activation in tasks  3) Step and reach forward  4) Step and reach backwards  5) Step and reach sideways 6) Rock and reach forward/backward  7) Rock and reach sideways Functional tasks: 1.Sit to stand functional component task with  supervision 10reps  2.simulated activities for: stepping up on a curb including tapping alternating feet and 10 x 2, and stepping up to a 6 inch step x 10 , 3. sitting on a chair at a table and leaning fwd to lift her bottom up to simulate the first step to scoot the chair fwd x 10, scooting down a mat x 5 repetitios 4.. putting on and off asweater with max assist x 5  5. tasks for improving hand coordination for buttons and writing activities including dealing cards and alternating front of the deck and back of the deck; Pt showed decreased stability and fatigue with balance tasks.   [place] with max VC for big swing and multi tasking with remembering 3 grocery store items that begin with an A and 3 items that begin with a B, 3 items that begin with a c, and 3 with a D.                            PT Education - 03/28/17 1405    Education provided Yes   Education Details correct exercise technique   Person(s) Educated Patient   Methods Explanation;Demonstration;Tactile cues   Comprehension Verbalized understanding;Returned demonstration;Verbal cues required          PT Short Term Goals - 03/05/17 1541      PT SHORT TERM GOAL #1   Title Patient (> 50 years old) will complete five times sit to stand test in < 15 seconds indicating an increased LE strength and improved balance.   Baseline 1.18 sec with use of LUE to push up   Time 2   Period Weeks   Status New   Target Date 03/21/17     PT SHORT TERM GOAL #2   Title Patient will be independent in home exercise program to improve strength/mobility for better functional independence with ADLs.   Baseline no HEP   Time 2   Period Weeks   Status New   Target Date 03/21/17           PT Long Term Goals - 03/25/17 1556      PT LONG TERM GOAL #1   Title Patient will reduce timed up and go to <11 seconds to reduce fall risk and demonstrate improved transfer/gait ability.   Time 4   Period Weeks    Status Partially Met   Target Date 04/08/17     PT LONG TERM GOAL #2   Title Patient will increase 10 meter walk test to >1.18ms as to improve gait speed for better community ambulation and to reduce fall risk.   Baseline 1.091   Time 4   Period Weeks   Status Partially Met   Target Date 04/08/17     PT LONG TERM GOAL #3   Title Patient will increase six minute walk test distance to >1000  for progression to community ambulator and improve gait ability   Baseline 980 feet   Time 4   Period Weeks   Status Partially Met   Target Date 04/08/17     PT LONG TERM GOAL #4   Title Pateint will improve the 9 peg hole test by 6 seconds to indicate improved function to be able to perform tasks with improved hand coordination.    Time 4   Period Weeks   Status Partially Met   Target Date 04/08/17               Plan - 03/28/17 1406    Clinical Impression Statement Patient needs cueing for BIG swing and BIG amplitude.  Patient needs cueing for correct BIG arm swing. He improves with practice.Patient needs cuing to consistently swing her arms and also swing reciprocally. Patient needs cuing to swing his UE's reciprocally with weight shift exercise and with backward stepping and correct UE positioning. Patient has unsteady stepping with several exercises but his motor control improve with practice. Patient needs occasional verbal cueing to improve posture and cueing to correctly perform exercises slowly, holding at end of range to increase motor firing of desired muscle to encourage fatigue.   Rehab Potential Good   PT Frequency 4x / week   PT Duration 4 weeks   PT Treatment/Interventions Gait training;Functional mobility training;Stair training;Balance training;Therapeutic exercise;Therapeutic activities;Neuromuscular re-education;Patient/family education   PT Next Visit Plan LSVT BIG- continue to work on standing on foam while doing alternating step taps progressions, consider adding foam  pad under feet as progression for sit to stand exercises.    PT Home Exercise Plan LSVT BIG   Consulted and Agree with Plan of Care Patient;Family member/caregiver      Patient will benefit from skilled therapeutic intervention in order to improve the following deficits and impairments:  Abnormal gait, Decreased balance, Decreased endurance, Decreased mobility, Difficulty walking, Pain, Impaired UE functional use, Impaired flexibility, Decreased strength, Decreased coordination, Decreased activity tolerance, Decreased cognition, Dizziness  Visit Diagnosis: Difficulty in walking, not elsewhere classified  Muscle weakness (generalized)     Problem List Patient Active Problem List   Diagnosis Date Noted  . 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 Internal hemorrhoids 03/01/2016  . Acute anxiety 11/22/2014  . 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 Depression 11/22/2014  . Sore throat 11/16/2014    Arelia Sneddon S,PT DPT 03/28/2017, 2:08 PM  [place] MAIN [facility] SERVICES [address] [place], [place], [address] Phone: [phone]   Fax:  [phone]  Name: DARICE VICARIO MRN: [id number] Date of Birth: 08/26/1945

## 2017-04-01 ENCOUNTER — Ambulatory Visit: Payer: Medicare Other | Admitting: Physical Therapy

## 2017-04-01 ENCOUNTER — Encounter: Payer: Self-pay | Admitting: Physical Therapy

## 2017-04-01 DIAGNOSIS — R262 Difficulty in walking, not elsewhere classified: Secondary | ICD-10-CM

## 2017-04-01 DIAGNOSIS — M6281 Muscle weakness (generalized): Secondary | ICD-10-CM

## 2017-04-01 NOTE — Therapy (Signed)
[place] MAIN [facility] SERVICES [address] [place], [place], [address] Phone: [phone]   Fax:  [phone]  Physical Therapy Treatment  Patient Details  Name: Lynn Bauer MRN: [id number] Date of Birth: Jul 25, 1945 Referring Provider: mark Sabra Heck   Encounter Date: 04/01/2017  PT End of Session - 04/01/17 0037    Visit Number  14    Number of Visits  17    Date for PT Re-Evaluation  04/08/17    Authorization Type  medicare 4/10    PT Start Time  0200    PT Stop Time  0300    PT Time Calculation (min)  60 min    Equipment Utilized During Treatment  Gait belt    Activity Tolerance  Patient tolerated treatment well    Behavior During Therapy  [facility] for tasks assessed/performed       Past Medical History:  Diagnosis Date  . Anemia    hx in her 30's  . Anxiety   . Depression   . GERD (gastroesophageal reflux disease)   . Hemorrhoids   . Hypertension   . Neuromuscular disorder ([place])   . Parkinson disease ([place]) 2014  . PONV (postoperative nausea and vomiting)   . Pre-diabetes   . Sleep apnea    Uses a C-Pap machine  . Vertigo     Past Surgical History:  Procedure Laterality Date  . ABDOMINAL HYSTERECTOMY    . COLONOSCOPY  2010    There were no vitals filed for this visit.  Subjective Assessment - 04/01/17 1404    Subjective  Patient says that her right shoulder is better today.  Patient and her husband stated that they are using the chair assist during the backward stepping..    Patient is accompained by:  Family member    Pertinent History  Patient was diagnosed with parkinsons 2015, she just started taking medicine for parkinsons disease 2 months ago and her tremors improved in her LE's.  she had a right shoulder replacement in may 2018. She was having a lot of difficulty with staning and balance.  she is not able to read due to not able to focus her eyes. She had 2 falls in the last 6 months.     Limitations   Lifting;Standing;Walking;House hold activities;Reading;Writing    How long can you stand comfortably?  10 minutes    How long can you walk comfortably?  10 minutes    Patient Stated Goals  to be able to be better with her walking and not fall.    Currently in Pain?  No/denies    Pain Score  0-No pain    Pain Onset  More than a month ago    Multiple Pain Sites  No       Patient seen for LSVT Daily Session Maximal Daily Exercises for facilitation/coordination of movement Sustained movements are designed to rescale the amplitude of movement output for generalization to daily functional activities .Performed as follows for 1 set of 10 repetitions each multidirectional sustained movements  1) Floor to ceiling  2) Side to side multidirectional Repetitive movements performed in standing and are designed to provide retraining effort needed for sustained muscle activation in tasks  3) Step and reach forward  4) Step and reach backwards  5) Step and reach sideways 6) Rock and reach forward/backward  7) Rock and reach sideways Functional tasks: 1.Sit to stand functional component task with supervision 10reps  2.simulated activities for: stepping up on a  curb including tapping alternating feet and 10 x 2, and stepping up to a 6 inch step x 10 , 3. sitting on a chair at a table and leaning fwd to lift her bottom up to simulate the first step to scoot the chair fwd x 10, scooting down a mat x 5 repetitios 4.. putting on and off a coat with max assist x 5  5. tasks for improving hand coordination for buttons and writing activities including dealing cards and alternating front of the deck and back of the deck; putting pegs into a grid left and right Pt showed decreased stability and fatigue with balance tasks.   [place] with max VC for big swing and multi tasking with remembering 3 grocery store items that begin with an A and 3 items that begin with a B, 3 items that begin with a c, and 3 with a  D.  Patient needs cues for initial start position and sequencing during backwards step and sitting side to side exercise. Patient has decreased motor planning skills and decreased speed of movement.                      PT Education - 04/01/17 1405    Education provided  Yes    Person(s) Educated  Patient    Methods  Explanation;Demonstration;Verbal cues    Comprehension  Verbalized understanding;Returned demonstration;Verbal cues required       PT Short Term Goals - 03/05/17 1541      PT SHORT TERM GOAL #1   Title  Patient (> 70 years old) will complete five times sit to stand test in < 15 seconds indicating an increased LE strength and improved balance.    Baseline  1.18 sec with use of LUE to push up    Time  2    Period  Weeks    Status  New    Target Date  03/21/17      PT SHORT TERM GOAL #2   Title  Patient will be independent in home exercise program to improve strength/mobility for better functional independence with ADLs.    Baseline  no HEP    Time  2    Period  Weeks    Status  New    Target Date  03/21/17        PT Long Term Goals - 03/25/17 1556      PT LONG TERM GOAL #1   Title  Patient will reduce timed up and go to <11 seconds to reduce fall risk and demonstrate improved transfer/gait ability.    Time  4    Period  Weeks    Status  Partially Met    Target Date  04/08/17      PT LONG TERM GOAL #2   Title  Patient will increase 10 meter walk test to >1.37ms as to improve gait speed for better community ambulation and to reduce fall risk.    Baseline  1.091    Time  4    Period  Weeks    Status  Partially Met    Target Date  04/08/17      PT LONG TERM GOAL #3   Title  Patient will increase six minute walk test distance to >1000 for progression to community ambulator and improve gait ability    Baseline  980 feet    Time  4    Period  Weeks    Status  Partially Met  Target Date  04/08/17      PT LONG TERM GOAL #4   Title   Pateint will improve the 9 peg hole test by 6 seconds to indicate improved function to be able to perform tasks with improved hand coordination.     Time  4    Period  Weeks    Status  Partially Met    Target Date  04/08/17            Plan - 04/01/17 1406    Clinical Impression Statement  Patient has difficulty with turning  head and rotating  trunk with weight shifting exercises. Patient has fatigue with standing exercises and needs constant VC to have correct posture. Patient has loss of balance and needs UE support intermittently thorough out exercise. Patient has slowness of movement during rotation and beginning movements.    Rehab Potential  Good    PT Frequency  4x / week    PT Duration  4 weeks    PT Treatment/Interventions  Gait training;Functional mobility training;Stair training;Balance training;Therapeutic exercise;Therapeutic activities;Neuromuscular re-education;Patient/family education    PT Next Visit Plan  LSVT BIG- continue to work on standing on foam while doing alternating step taps progressions, consider adding foam pad under feet as progression for sit to stand exercises.     PT Home Exercise Plan  LSVT BIG    Consulted and Agree with Plan of Care  Patient;Family member/caregiver       Patient will benefit from skilled therapeutic intervention in order to improve the following deficits and impairments:  Abnormal gait, Decreased balance, Decreased endurance, Decreased mobility, Difficulty walking, Pain, Impaired UE functional use, Impaired flexibility, Decreased strength, Decreased coordination, Decreased activity tolerance, Decreased cognition, Dizziness  Visit Diagnosis: Difficulty in walking, not elsewhere classified  Muscle weakness (generalized)     Problem List Patient Active Problem List   Diagnosis Date Noted  . 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 Internal hemorrhoids 03/01/2016  . Acute anxiety 11/22/2014  .  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 Depression 11/22/2014  . Sore throat 11/16/2014    Alanson Puls, PT DPT 04/01/2017, 2:10 PM  [place] MAIN [facility] SERVICES [address] [place], [place], [address] Phone: [phone]   Fax:  [phone]  Name: JALEY YAN MRN: [id number] Date of Birth: 1945-10-28

## 2017-04-02 ENCOUNTER — Encounter: Payer: Self-pay | Admitting: Physical Therapy

## 2017-04-02 ENCOUNTER — Ambulatory Visit: Payer: Medicare Other | Admitting: Physical Therapy

## 2017-04-02 DIAGNOSIS — M6281 Muscle weakness (generalized): Secondary | ICD-10-CM

## 2017-04-02 DIAGNOSIS — R262 Difficulty in walking, not elsewhere classified: Secondary | ICD-10-CM

## 2017-04-02 NOTE — Therapy (Signed)
[place] MAIN [facility] SERVICES [address] [place], [place], [address] Phone: [phone]   Fax:  [phone]  Physical Therapy Treatment  Patient Details  Name: Lynn Bauer MRN: [id number] Date of Birth: 1946/01/27 Referring Provider: mark Sabra Heck   Encounter Date: 04/02/2017  PT End of Session - 04/02/17 1408    Visit Number  15    Number of Visits  17    Date for PT Re-Evaluation  04/08/17    Authorization Type  medicare 5/10    PT Start Time  0200    PT Stop Time  0300    PT Time Calculation (min)  60 min    Equipment Utilized During Treatment  Gait belt    Activity Tolerance  Patient tolerated treatment well    Behavior During Therapy  [facility] for tasks assessed/performed       Past Medical History:  Diagnosis Date  . Anemia    hx in her 30's  . Anxiety   . Depression   . GERD (gastroesophageal reflux disease)   . Hemorrhoids   . Hypertension   . Neuromuscular disorder ([place])   . Parkinson disease ([place]) 2014  . PONV (postoperative nausea and vomiting)   . Pre-diabetes   . Sleep apnea    Uses a C-Pap machine  . Vertigo     Past Surgical History:  Procedure Laterality Date  . ABDOMINAL HYSTERECTOMY    . COLONOSCOPY  2010    There were no vitals filed for this visit.  Subjective Assessment - 04/02/17 1406    Subjective  Patient says that her right shoulder is better today.  Patient and her husband stated that they are using the chair assist during the backward stepping..    Patient is accompained by:  Family member    Pertinent History  Patient was diagnosed with parkinsons 2015, she just started taking medicine for parkinsons disease 2 months ago and her tremors improved in her LE's.  she had a right shoulder replacement in may 2018. She was having a lot of difficulty with staning and balance.  she is not able to read due to not able to focus her eyes. She had 2 falls in the last 6 months.     Limitations   Lifting;Standing;Walking;House hold activities;Reading;Writing    How long can you stand comfortably?  10 minutes    How long can you walk comfortably?  10 minutes    Patient Stated Goals  to be able to be better with her walking and not fall.    Currently in Pain?  No/denies    Pain Score  0-No pain    Pain Onset  More than a month ago    Multiple Pain Sites  No       Patient seen for LSVT Daily Session Maximal Daily Exercises for facilitation/coordination of movement Sustained movements are designed to rescale the amplitude of movement output for generalization to daily functional activities .Performed as follows for 1 set of 10 repetitions each multidirectional sustained movements  1) Floor to ceiling  2) Side to side multidirectional Repetitive movements performed in standing and are designed to provide retraining effort needed for sustained muscle activation in tasks  3) Step and reach forward  4) Step and reach backwards  5) Step and reach sideways 6) Rock and reach forward/backward  7) Rock and reach sideways Functional tasks: 1.Sit to stand functional component task with supervision 10reps  2.simulated activities for: stepping up on a  curb including tapping alternating feet and 10 x 2, and stepping up to a 6 inch step x 10 , 3. sitting on a chair at a table and leaning fwd to lift her bottom up to simulate the first step to scoot the chair fwd x 10, scooting down a mat x 5 repetitios 4.. putting on and off a coat with max assist x 5  5. tasks for improving hand coordination for buttons and writing activities including dealing cards and alternating front of the deck and back of the deck; putting pegs into a grid left and right Pt showed decreased stability and fatigue with balance tasks.   Walking BIG with max VC for big swing and multi tasking with remembering 3 grocery store items that begin with an A and 3 items that begin with a B, 3 items that begin with a c, and 3 with a  D. Patient needs cues for initial start position and sequencing during backwards step and sitting side to side exercise. Patient has decreased motor planning skills and decreased speed of movement.                       PT Education - 04/02/17 1407    Education provided  Yes    Education Details  start and stop positions and exercise technique    Person(s) Educated  Patient    Methods  Explanation;Demonstration    Comprehension  Verbalized understanding;Returned demonstration       PT Short Term Goals - 03/05/17 1541      PT SHORT TERM GOAL #1   Title  Patient (> 71 years old) will complete five times sit to stand test in < 15 seconds indicating an increased LE strength and improved balance.    Baseline  1.18 sec with use of LUE to push up    Time  2    Period  Weeks    Status  New    Target Date  03/21/17      PT SHORT TERM GOAL #2   Title  Patient will be independent in home exercise program to improve strength/mobility for better functional independence with ADLs.    Baseline  no HEP    Time  2    Period  Weeks    Status  New    Target Date  03/21/17        PT Long Term Goals - 03/25/17 1556      PT LONG TERM GOAL #1   Title  Patient will reduce timed up and go to <11 seconds to reduce fall risk and demonstrate improved transfer/gait ability.    Time  4    Period  Weeks    Status  Partially Met    Target Date  04/08/17      PT LONG TERM GOAL #2   Title  Patient will increase 10 meter walk test to >1.43ms as to improve gait speed for better community ambulation and to reduce fall risk.    Baseline  1.091    Time  4    Period  Weeks    Status  Partially Met    Target Date  04/08/17      PT LONG TERM GOAL #3   Title  Patient will increase six minute walk test distance to >1000 for progression to community ambulator and improve gait ability    Baseline  980 feet    Time  4    Period  Weeks  Status  Partially Met    Target Date  04/08/17       PT LONG TERM GOAL #4   Title  Pateint will improve the 9 peg hole test by 6 seconds to indicate improved function to be able to perform tasks with improved hand coordination.     Time  4    Period  Weeks    Status  Partially Met    Target Date  04/08/17            Plan - 04/02/17 1408    Clinical Impression Statement  Patient has difficulty with turning  head and rotating  trunk with weight shifting exercises. Patient needs cues to step back to start position each time with step fwd exercise and to finish big with each end position. . Patient has loss of balance and needs UE support occassionally thorough out exercise. Patient has slowness of movement during rotation and beginning movements.    Rehab Potential  Good    PT Frequency  4x / week    PT Duration  4 weeks    PT Treatment/Interventions  Gait training;Functional mobility training;Stair training;Balance training;Therapeutic exercise;Therapeutic activities;Neuromuscular re-education;Patient/family education    PT Next Visit Plan  LSVT BIG- continue to work on standing on foam while doing alternating step taps progressions, consider adding foam pad under feet as progression for sit to stand exercises.     PT Home Exercise Plan  LSVT BIG    Consulted and Agree with Plan of Care  Patient;Family member/caregiver       Patient will benefit from skilled therapeutic intervention in order to improve the following deficits and impairments:  Abnormal gait, Decreased balance, Decreased endurance, Decreased mobility, Difficulty walking, Pain, Impaired UE functional use, Impaired flexibility, Decreased strength, Decreased coordination, Decreased activity tolerance, Decreased cognition, Dizziness  Visit Diagnosis: Difficulty in walking, not elsewhere classified  Muscle weakness (generalized)     Problem List Patient Active Problem List   Diagnosis Date Noted  . 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  Internal hemorrhoids 03/01/2016  . Acute anxiety 11/22/2014  . 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 Depression 11/22/2014  . Sore throat 11/16/2014    Alanson Puls , PT DPT 04/02/2017, 2:55 PM  [place] MAIN [facility] SERVICES [address] [place], [place], [address] Phone: [phone]   Fax:  [phone]  Name: Lynn Bauer MRN: [id number] Date of Birth: Jun 22, 1945

## 2017-04-03 ENCOUNTER — Encounter: Payer: Self-pay | Admitting: Physical Therapy

## 2017-04-03 ENCOUNTER — Ambulatory Visit: Payer: Medicare Other | Admitting: Physical Therapy

## 2017-04-03 DIAGNOSIS — R262 Difficulty in walking, not elsewhere classified: Secondary | ICD-10-CM | POA: Diagnosis not present

## 2017-04-03 DIAGNOSIS — M6281 Muscle weakness (generalized): Secondary | ICD-10-CM

## 2017-04-03 NOTE — Therapy (Signed)
[place] MAIN [facility] SERVICES [address] [place], [place], [address] Phone: [phone]   Fax:  [phone]  Physical Therapy Treatment  Patient Details  Name: Lynn Bauer MRN: [id number] Date of Birth: [birth year] Referring Provider: mark Sabra Heck   Encounter Date: 04/03/2017  PT End of Session - 04/03/17 1405    Visit Number  16    Number of Visits  17    Date for PT Re-Evaluation  04/08/17    Authorization Type  medicare 6/10    PT Start Time  0200    PT Stop Time  0300    PT Time Calculation (min)  60 min    Equipment Utilized During Treatment  Gait belt    Activity Tolerance  Patient tolerated treatment well    Behavior During Therapy  [facility] for tasks assessed/performed       Past Medical History:  Diagnosis Date  . Anemia    hx in her 30's  . Anxiety   . Depression   . GERD (gastroesophageal reflux disease)   . Hemorrhoids   . Hypertension   . Neuromuscular disorder ([place])   . Parkinson disease ([place]) 2014  . PONV (postoperative nausea and vomiting)   . Pre-diabetes   . Sleep apnea    Uses a C-Pap machine  . Vertigo     Past Surgical History:  Procedure Laterality Date  . ABDOMINAL HYSTERECTOMY    . COLONOSCOPY  2010    There were no vitals filed for this visit.  Subjective Assessment - 04/02/17 1406    Subjective  Patient says that her right shoulder is better today.  Patient and her husband stated that they are using the chair assist during the backward stepping..    Patient is accompained by:  Family member    Pertinent History  Patient was diagnosed with parkinsons 2015, she just started taking medicine for parkinsons disease 2 months ago and her tremors improved in her LE's.  she had a right shoulder replacement in may 2018. She was having a lot of difficulty with staning and balance.  she is not able to read due to not able to focus her eyes. She had 2 falls in the last 6 months.     Limitations   Lifting;Standing;Walking;House hold activities;Reading;Writing    How long can you stand comfortably?  10 minutes    How long can you walk comfortably?  10 minutes    Patient Stated Goals  to be able to be better with her walking and not fall.    Currently in Pain?  No/denies    Pain Score  0-No pain    Pain Onset  More than a month ago    Multiple Pain Sites  No        Patient seen for LSVT Daily Session Maximal Daily Exercises for facilitation/coordination of movement Sustained movements are designed to rescale the amplitude of movement output for generalization to daily functional activities .Performed as follows for 1 set of 10 repetitions each multidirectional sustained movements  1) Floor to ceiling  2) Side to side multidirectional Repetitive movements performed in standing and are designed to provide retraining effort needed for sustained muscle activation in tasks  3) Step and reach forward  4) Step and reach backwards  5) Step and reach sideways 6) Rock and reach forward/backward  7) Rock and reach sideways Functional tasks: 1.Sit to stand functional component task with supervision 10reps  2.simulated activities for: stepping up on  a curb including tapping alternating feet and 10 x 2, and stepping up to a 6 inch step x 10 , 3. sitting on a chair at a table and leaning fwd to lift her bottom up to simulate the first step to scoot the chair fwd x 10, scooting down a mat x 5 repetitios 4.. putting on and off asweater with max assist x 5  5. tasks for improving hand coordination for buttons and writing activities including dealing cards and alternating front of the deck and back of the deck; Pt showed decreased stability and fatigue with balance tasks.   [place] with max VC for big swing and multi tasking with remembering 3 grocery store items that begin with an A and 3 items that begin with a B                       PT Education - 04/03/17 1404     Education provided  Yes    Education Details  HEP, lsvt big    Person(s) Educated  Patient;Spouse    Methods  Explanation;Demonstration;Tactile cues    Comprehension  Verbalized understanding;Returned demonstration       PT Short Term Goals - 03/05/17 1541      PT SHORT TERM GOAL #1   Title  Patient (71 years old) will complete five times sit to stand test in < 15 seconds indicating an increased LE strength and improved balance.    Baseline  1.18 sec with use of LUE to push up    Time  2    Period  Weeks    Status  New    Target Date  03/21/17      PT SHORT TERM GOAL #2   Title  Patient will be independent in home exercise program to improve strength/mobility for better functional independence with ADLs.    Baseline  no HEP    Time  2    Period  Weeks    Status  New    Target Date  03/21/17        PT Long Term Goals - 03/25/17 1556      PT LONG TERM GOAL #1   Title  Patient will reduce timed up and go to <11 seconds to reduce fall risk and demonstrate improved transfer/gait ability.    Time  4    Period  Weeks    Status  Partially Met    Target Date  04/08/17      PT LONG TERM GOAL #2   Title  Patient will increase 10 meter walk test to >1.82ms as to improve gait speed for better community ambulation and to reduce fall risk.    Baseline  1.091    Time  4    Period  Weeks    Status  Partially Met    Target Date  04/08/17      PT LONG TERM GOAL #3   Title  Patient will increase six minute walk test distance to >1000 for progression to community ambulator and improve gait ability    Baseline  980 feet    Time  4    Period  Weeks    Status  Partially Met    Target Date  04/08/17      PT LONG TERM GOAL #4   Title  Pateint will improve the 9 peg hole test by 6 seconds to indicate improved function to be able to perform tasks with improved hand  coordination.     Time  4    Period  Weeks    Status  Partially Met    Target Date  04/08/17            Plan -  04/03/17 1405    Clinical Impression Statement  Patient is able to get his coat on correctly and faster with new method and using large amplitude motions.Patient is able to perform his exercises with modeling and verbal correction and cueing to step in the correct position.  Patient need cueing for BIG arm swing, Patient needs cueing for BIG swing and adding dual cognitive loads and distracters.    Rehab Potential  Good    PT Frequency  4x / week    PT Duration  4 weeks    PT Treatment/Interventions  Gait training;Functional mobility training;Stair training;Balance training;Therapeutic exercise;Therapeutic activities;Neuromuscular re-education;Patient/family education    PT Next Visit Plan  LSVT BIG- continue to work on standing on foam while doing alternating step taps progressions, consider adding foam pad under feet as progression for sit to stand exercises.     PT Home Exercise Plan  LSVT BIG    Consulted and Agree with Plan of Care  Patient;Family member/caregiver       Patient will benefit from skilled therapeutic intervention in order to improve the following deficits and impairments:  Abnormal gait, Decreased balance, Decreased endurance, Decreased mobility, Difficulty walking, Pain, Impaired UE functional use, Impaired flexibility, Decreased strength, Decreased coordination, Decreased activity tolerance, Decreased cognition, Dizziness  Visit Diagnosis: Difficulty in walking, not elsewhere classified  Muscle weakness (generalized)     Problem List Patient Active Problem List   Diagnosis Date Noted  . 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 Internal hemorrhoids 03/01/2016  . Acute anxiety 11/22/2014  . 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 Depression 11/22/2014  . Sore throat 11/16/2014    Arelia Sneddon S,pt dpt 04/03/2017, 2:09 PM  [place] MAIN [facility] SERVICES [address] [place], [place],  [address] Phone: [phone]   Fax:  [phone]  Name: Lynn Bauer MRN: [id number] Date of Birth: [birth year]

## 2017-04-04 ENCOUNTER — Ambulatory Visit: Payer: Medicare Other | Admitting: Physical Therapy

## 2017-04-04 ENCOUNTER — Encounter: Payer: Self-pay | Admitting: Physical Therapy

## 2017-04-04 DIAGNOSIS — M25641 Stiffness of right hand, not elsewhere classified: Secondary | ICD-10-CM

## 2017-04-04 DIAGNOSIS — R208 Other disturbances of skin sensation: Secondary | ICD-10-CM

## 2017-04-04 DIAGNOSIS — M6281 Muscle weakness (generalized): Secondary | ICD-10-CM

## 2017-04-04 DIAGNOSIS — R262 Difficulty in walking, not elsewhere classified: Secondary | ICD-10-CM | POA: Diagnosis not present

## 2017-04-04 NOTE — Therapy (Deleted)
[place] [facility] [address] [place], [place], [address] Phone: [phone]   Fax:  [phone]  April 04, 2017   @CCLISTADDRESS @  Physical Therapy Discharge Summary  Patient: Lynn Bauer  MRN: [id number]  Date of Birth: 10/19/1945   Diagnosis: Difficulty in walking, not elsewhere classified  Muscle weakness (generalized)  Other disturbances of skin sensation  Stiffness of right hand, not elsewhere classified Referring Provider: mark miller   The above patient had been seen in Physical Therapy *** times of *** treatments scheduled with *** no shows and *** cancellations.  The treatment consisted of *** The patient is: {improved/worse/unchanged:3041574}  Subjective: ***  Discharge Findings: ***  Functional Status at Discharge: ***  {XLKGM:[id number]}{GOALS:[id number]}  Plan - 04/04/17 1403    Clinical Impression Statement  Patient needs cueing for BIG swing and BIG amplitude.  Patient needs cueing for correct BIG arm swing. Patient  improves with practice. Patient needs cuing to consistently swing her arms and also swing reciprocally. Patient needs cuing to swing his UE's reciprocally with weight shift exercise and with backward stepping and correct UE positioning. Patient has unsteady stepping with several exercises but motor control improve with practice. Patient needs occasional verbal cueing to improve posture and cueing to correctly perform exercises slowly, holding at end of range to increase motor firing of desired muscle to encourage fatigue.    Rehab Potential  Good    PT Frequency  4x / week    PT Duration  4 weeks    PT Treatment/Interventions  Gait training;Functional mobility training;Stair training;Balance training;Therapeutic exercise;Therapeutic activities;Neuromuscular re-education;Patient/family education    PT Next Visit Plan  LSVT BIG- continue to work on standing on foam while doing alternating step taps progressions,  consider adding foam pad under feet as progression for sit to stand exercises.     PT Home Exercise Plan  LSVT BIG    Consulted and Agree with Plan of Care  Patient;Family member/caregiver       Sincerely,   Ezekiel InaMansfield, Jayton Popelka S, PT   CC @[email or web address] @  [facility] [address] [place], [place], [address] Phone: [phone]   Fax:  [phone]  Patient: Lynn Bauer  MRN: [id number]  Date of Birth: 04/21/1946

## 2017-04-04 NOTE — Therapy (Signed)
[place] MAIN [facility] SERVICES [address] [place], [place], [address] Phone: [phone]   Fax:  [phone]  Physical Therapy Treatment/ Discharge Summary  Patient Details  Name: Lynn Bauer MRN: [id number] Date of Birth: 11-17-1945 Referring Provider: mark Sabra Heck   Encounter Date: 04/04/2017  PT End of Session - 04/04/17 1402    Visit Number  17    Number of Visits  17    Date for PT Re-Evaluation  04/08/17    Authorization Type  medicare 7/10    PT Start Time  0200    PT Stop Time  0300    PT Time Calculation (min)  60 min    Equipment Utilized During Treatment  Gait belt    Activity Tolerance  Patient tolerated treatment well    Behavior During Therapy  [facility] for tasks assessed/performed       Past Medical History:  Diagnosis Date  . Anemia    hx in her 71's  . Anxiety   . Depression   . GERD (gastroesophageal reflux disease)   . Hemorrhoids   . Hypertension   . Neuromuscular disorder ([place])   . Parkinson disease ([place]) 2014  . PONV (postoperative nausea and vomiting)   . Pre-diabetes   . Sleep apnea    Uses a C-Pap machine  . Vertigo     Past Surgical History:  Procedure Laterality Date  . ABDOMINAL HYSTERECTOMY    . COLONOSCOPY  2010    There were no vitals filed for this visit.    Patient seen for LSVT Daily Session Maximal Daily Exercises for facilitation/coordination of movement Sustained movements are designed to rescale the amplitude of movement output for generalization to daily functional activities .Performed as follows for 1 set of 10 repetitions each multidirectional sustained movements  1) Floor to ceiling  2) Side to side multidirectional Repetitive movements performed in standing and are designed to provide retraining effort needed for sustained muscle activation in tasks  3) Step and reach forward  4) Step and reach backwards  5) Step and reach sideways 6) Rock and reach forward/backward  7)  Rock and reach sideways Functional tasks: 1.Sit to stand functional component task with supervision 10reps  2.simulated activities for: stepping up on a curb including tapping alternating feet and 10 x 2, and stepping up to a 6 inch step x 10 , 3. sitting on a chair at a table and leaning fwd to lift her bottom up to simulate the first step to scoot the chair fwd x 10, scooting down a mat x 5 repetitios 4.. putting on and off asweater with max assist x 5  5. tasks for improving hand coordination for buttons and writing activities including dealing cards and alternating front of the deck and back of the deck; Pt showed decreased stability and fatigue with balance tasks.  Outcome measures were performed including 5 x sit to stand, TUG, 6 MW, 10 MW and peg test and improvements were made in all measures.                         PT Education - 04/04/17 1402    Education provided  Yes    Education Details  HEP start and stop positions    Person(s) Educated  Patient    Methods  Explanation;Demonstration    Comprehension  Verbalized understanding;Returned demonstration       PT Short Term Goals - 04/04/17 1428  PT SHORT TERM GOAL #1   Title  Patient (> 71 years old) will complete five times sit to stand test in < 15 seconds indicating an increased LE strength and improved balance.    Baseline  1.18 sec with use of LUE to push up,20.53 sec    Time  2    Period  Weeks    Status  Achieved      PT SHORT TERM GOAL #2   Title  Patient will be independent in home exercise program to improve strength/mobility for better functional independence with ADLs.    Baseline  no HEP    Time  2    Period  Weeks    Status  Achieved        PT Long Term Goals - 04/04/17 1413      PT LONG TERM GOAL #1   Title  Patient will reduce timed up and go to <11 seconds to reduce fall risk and demonstrate improved transfer/gait ability.    Baseline  17.43 sec with UE to push  up,13.61  (Pended)     Time  4    Period  Weeks    Status  Achieved    Target Date  04/08/17      PT LONG TERM GOAL #2   Title  Patient will increase 10 meter walk test to >1.25ms as to improve gait speed for better community ambulation and to reduce fall risk.    Baseline  1.091,1.15 sec  (Pended)     Time  4    Period  Weeks    Target Date  04/08/17      PT LONG TERM GOAL #3   Title  Patient will increase six minute walk test distance to >1000 for progression to community ambulator and improve gait ability    Baseline  980 feet    Time  4    Period  Weeks    Status  Partially Met    Target Date  04/08/17      PT LONG TERM GOAL #4   Title  Pateint will improve the 9 peg hole test by 6 seconds to indicate improved function to be able to perform tasks with improved hand coordination.     Baseline  36.96 L ;  32.73 right,35.04 sec, 51.61    Time  4    Period  Weeks    Status  Partially Met    Target Date  04/08/17            Plan - 04/04/17 1403    Clinical Impression Statement  Patient needs cueing for BIG swing and BIG amplitude.  . Patients exercises  improve with practice. Patient needs cuing to consistently swing her arms and also swing reciprocally. Patient needs cuing to swing  UE's reciprocally with weight shift exercise and with backward stepping and correct UE positioning. Patient has unsteady stepping with several exercises but motor control improve with practice. Patient needs occasional verbal cueing to improve posture and cueing to correctly perform exercises slowly, holding at end of range to increase motor firing of desired muscle to encourage fatigue. She is being discharges from PT and will continue with HEP.    Rehab Potential  Good    PT Frequency  4x / week    PT Duration  4 weeks    PT Treatment/Interventions  Gait training;Functional mobility training;Stair training;Balance training;Therapeutic exercise;Therapeutic activities;Neuromuscular  re-education;Patient/family education    PT Next Visit Plan  LSVT BIG- continue to  work on standing on foam while doing alternating step taps progressions, consider adding foam pad under feet as progression for sit to stand exercises.     PT Home Exercise Plan  LSVT BIG    Consulted and Agree with Plan of Care  Patient;Family member/caregiver       Patient will benefit from skilled therapeutic intervention in order to improve the following deficits and impairments:  Abnormal gait, Decreased balance, Decreased endurance, Decreased mobility, Difficulty walking, Pain, Impaired UE functional use, Impaired flexibility, Decreased strength, Decreased coordination, Decreased activity tolerance, Decreased cognition, Dizziness  Visit Diagnosis: Difficulty in walking, not elsewhere classified  Muscle weakness (generalized)  Other disturbances of skin sensation  Stiffness of right hand, not elsewhere classified     Problem List Patient Active Problem List   Diagnosis Date Noted  . 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 Internal hemorrhoids 03/01/2016  . Acute anxiety 11/22/2014  . 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 Depression 11/22/2014  . Sore throat 11/16/2014    Alanson Puls, PT DPT 04/04/2017, 3:08 PM  [place] MAIN [facility] SERVICES [address] [place], [place], [address] Phone: [phone]   Fax:  [phone]  Name: ADYSSON REVELLE MRN: [id number] Date of Birth: 01-21-1946

## 2017-10-08 ENCOUNTER — Other Ambulatory Visit: Payer: Self-pay | Admitting: Internal Medicine

## 2017-10-08 DIAGNOSIS — Z1231 Encounter for screening mammogram for malignant neoplasm of breast: Secondary | ICD-10-CM

## 2017-10-25 ENCOUNTER — Ambulatory Visit
Admission: RE | Admit: 2017-10-25 | Discharge: 2017-10-25 | Disposition: A | Discharge status: Home / Self Care | Payer: Medicare Other | Source: Ambulatory Visit | Attending: Internal Medicine | Admitting: Internal Medicine

## 2017-10-25 DIAGNOSIS — Z1231 Encounter for screening mammogram for malignant neoplasm of breast: Secondary | ICD-10-CM | POA: Insufficient documentation

## 2018-12-09 ENCOUNTER — Other Ambulatory Visit: Payer: Self-pay | Admitting: Internal Medicine

## 2018-12-09 DIAGNOSIS — Z1231 Encounter for screening mammogram for malignant neoplasm of breast: Secondary | ICD-10-CM

## 2019-01-20 ENCOUNTER — Other Ambulatory Visit: Payer: Self-pay

## 2019-01-20 ENCOUNTER — Ambulatory Visit
Admission: RE | Admit: 2019-01-20 | Discharge: 2019-01-20 | Disposition: A | Discharge status: Home / Self Care | Payer: Medicare Other | Source: Ambulatory Visit | Attending: Internal Medicine | Admitting: Internal Medicine

## 2019-01-20 DIAGNOSIS — Z1231 Encounter for screening mammogram for malignant neoplasm of breast: Secondary | ICD-10-CM | POA: Diagnosis not present

## 2019-01-27 ENCOUNTER — Other Ambulatory Visit: Payer: Self-pay | Admitting: Internal Medicine

## 2019-01-27 DIAGNOSIS — R928 Other abnormal and inconclusive findings on diagnostic imaging of breast: Secondary | ICD-10-CM

## 2019-01-27 DIAGNOSIS — N6489 Other specified disorders of breast: Secondary | ICD-10-CM

## 2019-02-04 ENCOUNTER — Ambulatory Visit
Admission: RE | Admit: 2019-02-04 | Discharge: 2019-02-04 | Disposition: A | Discharge status: Home / Self Care | Payer: Medicare Other | Source: Ambulatory Visit | Attending: Internal Medicine | Admitting: Internal Medicine

## 2019-02-04 DIAGNOSIS — R928 Other abnormal and inconclusive findings on diagnostic imaging of breast: Secondary | ICD-10-CM | POA: Diagnosis present

## 2019-02-04 DIAGNOSIS — N6489 Other specified disorders of breast: Secondary | ICD-10-CM

## 2019-03-18 ENCOUNTER — Other Ambulatory Visit: Payer: Self-pay

## 2019-03-18 ENCOUNTER — Other Ambulatory Visit: Payer: Self-pay | Admitting: Internal Medicine

## 2019-03-18 ENCOUNTER — Ambulatory Visit
Admission: RE | Admit: 2019-03-18 | Discharge: 2019-03-18 | Disposition: A | Discharge status: Home / Self Care | Payer: Medicare Other | Source: Ambulatory Visit | Attending: Internal Medicine | Admitting: Internal Medicine

## 2019-03-18 DIAGNOSIS — R1011 Right upper quadrant pain: Secondary | ICD-10-CM | POA: Insufficient documentation

## 2019-11-16 ENCOUNTER — Other Ambulatory Visit: Payer: Self-pay

## 2019-11-16 ENCOUNTER — Other Ambulatory Visit
Admission: RE | Admit: 2019-11-16 | Discharge: 2019-11-16 | Disposition: A | Discharge status: Home / Self Care | Payer: Medicare Other | Source: Ambulatory Visit | Attending: Internal Medicine | Admitting: Internal Medicine

## 2019-11-16 DIAGNOSIS — Z01812 Encounter for preprocedural laboratory examination: Secondary | ICD-10-CM | POA: Insufficient documentation

## 2019-11-16 DIAGNOSIS — Z20822 Contact with and (suspected) exposure to covid-19: Secondary | ICD-10-CM | POA: Diagnosis not present

## 2019-11-17 ENCOUNTER — Encounter: Payer: Self-pay | Admitting: Internal Medicine

## 2019-11-17 LAB — SARS CORONAVIRUS 2 (TAT 6-24 HRS): SARS Coronavirus 2: NEGATIVE

## 2019-11-18 ENCOUNTER — Encounter
Admission: RE | Disposition: A | Discharge status: Home / Self Care | Payer: Self-pay | Source: Home / Self Care | Attending: Internal Medicine

## 2019-11-18 ENCOUNTER — Ambulatory Visit: Payer: Medicare Other | Admitting: Certified Registered Nurse Anesthetist

## 2019-11-18 ENCOUNTER — Ambulatory Visit
Admission: RE | Admit: 2019-11-18 | Discharge: 2019-11-18 | Disposition: A | Discharge status: Home / Self Care | Payer: Medicare Other | Attending: Internal Medicine | Admitting: Internal Medicine

## 2019-11-18 ENCOUNTER — Other Ambulatory Visit: Payer: Self-pay

## 2019-11-18 ENCOUNTER — Encounter: Payer: Self-pay | Admitting: Internal Medicine

## 2019-11-18 DIAGNOSIS — Z880 Allergy status to penicillin: Secondary | ICD-10-CM | POA: Insufficient documentation

## 2019-11-18 DIAGNOSIS — K2970 Gastritis, unspecified, without bleeding: Secondary | ICD-10-CM | POA: Diagnosis not present

## 2019-11-18 DIAGNOSIS — Z79899 Other long term (current) drug therapy: Secondary | ICD-10-CM | POA: Insufficient documentation

## 2019-11-18 DIAGNOSIS — G4730 Sleep apnea, unspecified: Secondary | ICD-10-CM | POA: Diagnosis not present

## 2019-11-18 DIAGNOSIS — J449 Chronic obstructive pulmonary disease, unspecified: Secondary | ICD-10-CM | POA: Diagnosis not present

## 2019-11-18 DIAGNOSIS — F0280 Dementia in other diseases classified elsewhere without behavioral disturbance: Secondary | ICD-10-CM | POA: Insufficient documentation

## 2019-11-18 DIAGNOSIS — I10 Essential (primary) hypertension: Secondary | ICD-10-CM | POA: Diagnosis not present

## 2019-11-18 DIAGNOSIS — F419 Anxiety disorder, unspecified: Secondary | ICD-10-CM | POA: Diagnosis not present

## 2019-11-18 DIAGNOSIS — R7303 Prediabetes: Secondary | ICD-10-CM | POA: Insufficient documentation

## 2019-11-18 DIAGNOSIS — Z881 Allergy status to other antibiotic agents status: Secondary | ICD-10-CM | POA: Diagnosis not present

## 2019-11-18 DIAGNOSIS — R1314 Dysphagia, pharyngoesophageal phase: Secondary | ICD-10-CM | POA: Insufficient documentation

## 2019-11-18 DIAGNOSIS — G2581 Restless legs syndrome: Secondary | ICD-10-CM | POA: Diagnosis not present

## 2019-11-18 DIAGNOSIS — K219 Gastro-esophageal reflux disease without esophagitis: Secondary | ICD-10-CM | POA: Diagnosis not present

## 2019-11-18 DIAGNOSIS — G709 Myoneural disorder, unspecified: Secondary | ICD-10-CM | POA: Diagnosis not present

## 2019-11-18 DIAGNOSIS — F329 Major depressive disorder, single episode, unspecified: Secondary | ICD-10-CM | POA: Insufficient documentation

## 2019-11-18 HISTORY — DX: Chronic obstructive pulmonary disease, unspecified: J44.9

## 2019-11-18 HISTORY — PX: ESOPHAGOGASTRODUODENOSCOPY (EGD) WITH PROPOFOL: SHX5813

## 2019-11-18 HISTORY — DX: Restless legs syndrome: G25.81

## 2019-11-18 SURGERY — ESOPHAGOGASTRODUODENOSCOPY (EGD) WITH PROPOFOL
Anesthesia: General

## 2019-11-18 MED ORDER — LIDOCAINE HCL (CARDIAC) PF 100 MG/5ML IV SOSY
PREFILLED_SYRINGE | INTRAVENOUS | Status: DC | PRN
  Administered 2019-11-18: 60 mg via INTRAVENOUS

## 2019-11-18 MED ORDER — PROPOFOL 10 MG/ML IV BOLUS
INTRAVENOUS | Status: AC
  Filled 2019-11-18: qty 40

## 2019-11-18 MED ORDER — PROPOFOL 10 MG/ML IV BOLUS
INTRAVENOUS | Status: DC | PRN
  Administered 2019-11-18: 40 mg via INTRAVENOUS

## 2019-11-18 MED ORDER — PROPOFOL 500 MG/50ML IV EMUL
INTRAVENOUS | Status: DC | PRN
  Administered 2019-11-18: 100 ug/kg/min via INTRAVENOUS

## 2019-11-18 MED ORDER — SODIUM CHLORIDE 0.9 % IV SOLN
INTRAVENOUS | Status: DC
  Administered 2019-11-18: 09:00:00 20 mL/h via INTRAVENOUS

## 2019-11-18 NOTE — H&P (Signed)
Outpatient short stay form Pre-procedure 11/18/2019 9:55 AM Rabiah Goeser K. Alice Reichert, M.D.  Primary Physician: Emily Filbert, M.D.  Reason for visit:  Pharyngeal dysphagia  History of present illness:  74 y/o female with personal hx of Parkinsonism and dementia presents for dysphagia localized to the throat and area just above the suprasternal notch. NO hemetemesis, melena, or abdominal pain.    Current Facility-Administered Medications:  .  0.9 %  sodium chloride infusion, , Intravenous, Continuous, [place], Benay Pike, MD, Last Rate: 20 mL/hr at 11/18/19 0912, 20 mL/hr at 11/18/19 0912  Medications Prior to Admission  Medication Sig Dispense Refill Last Dose  . acetaminophen (TYLENOL) 500 MG chewable tablet Chew 500 mg by mouth every 6 (six) hours as needed for pain.   Past Week at Unknown time  . ALPRAZolam (XANAX) 0.5 MG tablet TAKE ONE TABLET THREE TIMES DAILY AS NEEDED FOR ANXIETY (Patient taking differently: Take 0.5 mg by mouth 3 (three) times daily as needed for anxiety. ) 90 tablet 2 11/18/2019 at 0545  . azelastine (ASTELIN) 0.1 % nasal spray Place 2 sprays into both nostrils at bedtime. Use in each nostril as directed   11/17/2019 at Unknown time  . carbidopa-levodopa (SINEMET CR) 50-200 MG tablet Take 2 tablets by mouth 3 (three) times daily. For 90 days   11/18/2019 at 0545  . cholecalciferol (VITAMIN D) 1000 units tablet Take 2,000 Units by mouth daily.   11/17/2019 at Unknown time  . desipramine (NORPRAMIN) 25 MG tablet Take 25 mg by mouth at bedtime.   11/17/2019 at Unknown time  . escitalopram (LEXAPRO) 10 MG tablet Take 10 mg by mouth daily.   11/17/2019 at Unknown time  . hydroxypropyl methylcellulose / hypromellose (ISOPTO TEARS / GONIOVISC) 2.5 % ophthalmic solution Place 1 drop into both eyes as needed for dry eyes.   11/17/2019 at Unknown time  . hydrOXYzine (ATARAX/VISTARIL) 25 MG tablet Take 25 mg by mouth 3 (three) times daily.   11/18/2019 at 0545  . ibuprofen (ADVIL,MOTRIN) 200 MG  tablet Take 200 mg by mouth every 6 (six) hours as needed for mild pain.   Past Week at Unknown time  . metoprolol succinate (TOPROL-XL) 50 MG 24 hr tablet Take 50 mg by mouth daily.   11/17/2019 at Unknown time  . Misc Natural Products (FIBER 7 PO) Take 3 tablets by mouth 3 (three) times daily.    Past Week at Unknown time  . olmesartan-hydrochlorothiazide (BENICAR HCT) 40-12.5 MG tablet Take 0.25 tablets by mouth nightly   11/17/2019 at Unknown time  . omeprazole (PRILOSEC) 20 MG capsule Take 40 mg by mouth daily.    11/17/2019 at Unknown time  . phenylephrine (SUDAFED PE) 10 MG TABS tablet Take 10 mg by mouth every 4 (four) hours as needed.   11/17/2019 at Unknown time  . polyethylene glycol (MIRALAX / GLYCOLAX) 17 g packet Take 17 g by mouth daily.     . rivastigmine (EXELON) 3 MG capsule Take 3 mg by mouth daily.   11/17/2019 at Unknown time  . rivastigmine (EXELON) 6 MG capsule Take 6 mg by mouth 2 (two) times daily with a meal.   11/17/2019 at Unknown time  . sucralfate (CARAFATE) 1 g tablet Take 1 g by mouth 4 (four) times daily -  with meals and at bedtime.     . vitamin B-12 (CYANOCOBALAMIN) 1000 MCG tablet Take 1,000 mcg by mouth daily.   Past Week at Unknown time  . atorvastatin (LIPITOR) 20 MG tablet TAKE ONE  TABLET AT BEDTIME (Patient not taking: Reported on 09/28/2016) 30 tablet 7   . LINZESS 145 MCG CAPS capsule TAKE ONE CAPSULE DAILY 30 capsule 0   . oxyCODONE (ROXICODONE) 5 MG immediate release tablet Take 1-2 tablets (5-10 mg total) by mouth every 4 (four) hours as needed for severe pain. 60 tablet 0   . PARoxetine (PAXIL) 40 MG tablet Take 40 mg by mouth daily.     . psyllium (HYDROCIL/METAMUCIL) 95 % PACK Take 1 packet by mouth 3 (three) times daily as needed for mild constipation.     Marland Kitchen telmisartan (MICARDIS) 80 MG tablet TAKE 1 TABLET EVERY DAY 30 tablet 5      Allergies  Allergen Reactions  . Erythromycin Hives  . Penicillins Hives     Past Medical History:  Diagnosis Date   . Anemia    hx in her 30's  . Anxiety   . COPD (chronic obstructive pulmonary disease) (HCC)   . Depression   . GERD (gastroesophageal reflux disease)   . Hemorrhoids   . Hypertension   . Neuromuscular disorder ([facility])   . Parkinson disease ([facility]) 2014  . PONV (postoperative nausea and vomiting)   . Pre-diabetes   . Restless leg syndrome   . Sleep apnea    Uses a C-Pap machine  . Vertigo     Review of systems:  Otherwise negative.    Physical Exam  Gen: Alert, oriented. Appears stated age.  HEENT: [place]/AT. PERRLA. Lungs: CTA, no wheezes. CV: RR nl S1, S2. Abd: soft, benign, no masses. BS+ Ext: No edema. Pulses 2+    Planned procedures: Proceed with EGD. The patient understands the nature of the planned procedure, indications, risks, alternatives and potential complications including but not limited to bleeding, infection, perforation, damage to internal organs and possible oversedation/side effects from anesthesia. The patient agrees and gives consent to proceed.  Please refer to procedure notes for findings, recommendations and patient disposition/instructions.     Sharry Beining K. Norma Fredrickson, M.D. Gastroenterology 11/18/2019  9:55 AM

## 2019-11-18 NOTE — Op Note (Signed)
[facility] Gastroenterology Patient Name: Lynn Bauer Procedure Date: 11/18/2019 9:51 AM MRN: [id number] Account #: [id number] Date of Birth: Jan 07, 1946 Admit Type: Outpatient Age: 74 Room: [facility] ENDO ROOM 3 Gender: Female Note Status: Finalized Procedure:             Upper GI endoscopy Indications:           Pharyngeal phase dysphagia, Esophageal dysphagia Providers:             Boykin Nearing. Cloey Sferrazza MD, MD Medicines:             Propofol per Anesthesia Complications:         No immediate complications. Procedure:             Pre-Anesthesia Assessment:                        - The risks and benefits of the procedure and the                         sedation options and risks were discussed with the                         patient. All questions were answered and informed                         consent was obtained.                        - Patient identification and proposed procedure were                         verified prior to the procedure by the nurse. The                         procedure was verified in the procedure room.                        - ASA Grade Assessment: III - A patient with severe                         systemic disease.                        - After reviewing the risks and benefits, the patient                         was deemed in satisfactory condition to undergo the                         procedure.                        After obtaining informed consent, the endoscope was                         passed under direct vision. Throughout the procedure,                         the patient's blood pressure, pulse, and oxygen  saturations were monitored continuously. The Endoscope                         was introduced through the mouth, and advanced to the                         third part of duodenum. The upper GI endoscopy was                         accomplished without difficulty. The patient tolerated                          the procedure well. Findings:      Abnormal motility was noted in the esophagus. The cricopharyngeus was       abnormal. There is Tertiary contractions of the esophageal body. The       distal esophagus/lower esophageal sphincter is spastic, but gives up       passage to the endoscope. Tertiary peristaltic waves are noted. The       scope was withdrawn. Dilation was performed with a Maloney dilator with       mild resistance at 54 Fr.      Localized mild inflammation characterized by erosions was found in the       gastric fundus. Biopsies were taken with a cold forceps for histology.      The examined duodenum was normal.      The exam was otherwise without abnormality. Impression:            - Abnormal esophageal motility. Dilated.                        - Gastritis. Biopsied.                        - Normal examined duodenum.                        - The examination was otherwise normal. Recommendation:        - Patient has a contact number available for                         emergencies. The signs and symptoms of potential                         delayed complications were discussed with the patient.                         Return to normal activities tomorrow. Written                         discharge instructions were provided to the patient.                        - Resume previous diet.                        - Continue present medications.                        - Await pathology results.                        -  Monitor results to esophageal dilation                        - Return to my office in 6 weeks.                        - The findings and recommendations were discussed with                         the patient. Procedure Code(s):     --- Professional ---                        [phone], Esophagogastroduodenoscopy, flexible,                         transoral; with biopsy, single or multiple                        43450, Dilation of esophagus, by unguided  sound or                         bougie, single or multiple passes Diagnosis Code(s):     --- Professional ---                        R13.14, Dysphagia, pharyngoesophageal phase                        R13.13, Dysphagia, pharyngeal phase                        K29.70, Gastritis, unspecified, without bleeding                        K22.4, Dyskinesia of esophagus CPT copyright 2019 American Medical Association. All rights reserved. The codes documented in this report are preliminary and upon coder review may  be revised to meet current compliance requirements. Efrain Sella MD, MD 11/18/2019 10:19:29 AM This report has been signed electronically. Number of Addenda: 0 Note Initiated On: 11/18/2019 9:51 AM Estimated Blood Loss:  Estimated blood loss: none.      [facility]

## 2019-11-18 NOTE — Anesthesia Postprocedure Evaluation (Signed)
Anesthesia Post Note  Patient: Olivia Mackie  Procedure(s) Performed: ESOPHAGOGASTRODUODENOSCOPY (EGD) WITH PROPOFOL (N/A )  Patient location during evaluation: Endoscopy Anesthesia Type: General Level of consciousness: awake and alert and oriented Pain management: pain level controlled Vital Signs Assessment: post-procedure vital signs reviewed and stable Respiratory status: spontaneous breathing Cardiovascular status: blood pressure returned to baseline Anesthetic complications: no   No complications documented.   Last Vitals:  Vitals:   11/18/19 1030 11/18/19 1040  BP: (!) 109/51 (!) 116/55  Pulse: (!) 53 (!) 51  Resp: (!) 22 11  Temp:    SpO2: 97% 99%    Last Pain:  Vitals:   11/18/19 1040  TempSrc:   PainSc: 0-No pain                 Adalis Gatti

## 2019-11-18 NOTE — Interval H&P Note (Signed)
History and Physical Interval Note:  11/18/2019 9:56 AM  Lynn Bauer  has presented today for surgery, with the diagnosis of PHARYNGOESOPHAGEAL DYSPHAGIA.  The various methods of treatment have been discussed with the patient and family. After consideration of risks, benefits and other options for treatment, the patient has consented to  Procedure(s): ESOPHAGOGASTRODUODENOSCOPY (EGD) WITH PROPOFOL (N/A) as a surgical intervention.  The patient's history has been reviewed, patient examined, no change in status, stable for surgery.  I have reviewed the patient's chart and labs.  Questions were answered to the patient's satisfaction.     Rolena Knutson   

## 2019-11-18 NOTE — Transfer of Care (Signed)
Immediate Anesthesia Transfer of Care Note  Patient: Lynn Bauer  Procedure(s) Performed: ESOPHAGOGASTRODUODENOSCOPY (EGD) WITH PROPOFOL (N/A )  Patient Location: PACU  Anesthesia Type:General  Level of Consciousness: awake  Airway & Oxygen Therapy: Patient Spontanous Breathing  Post-op Assessment: Report given to RN  Post vital signs: Reviewed and stable  Last Vitals:  Vitals Value Taken Time  BP 103/45 11/18/19 1020  Temp 36.1 C 11/18/19 1020  Pulse 55 11/18/19 1020  Resp 20 11/18/19 1020  SpO2 99 % 11/18/19 1020    Last Pain:  Vitals:   11/18/19 1020  TempSrc:   PainSc: Asleep         Complications: No complications documented.

## 2019-11-18 NOTE — Interval H&P Note (Signed)
History and Physical Interval Note:  11/18/2019 9:56 AM  Lynn Bauer  has presented today for surgery, with the diagnosis of PHARYNGOESOPHAGEAL DYSPHAGIA.  The various methods of treatment have been discussed with the patient and family. After consideration of risks, benefits and other options for treatment, the patient has consented to  Procedure(s): ESOPHAGOGASTRODUODENOSCOPY (EGD) WITH PROPOFOL (N/A) as a surgical intervention.  The patient's history has been reviewed, patient examined, no change in status, stable for surgery.  I have reviewed the patient's chart and labs.  Questions were answered to the patient's satisfaction.     [place], [place]

## 2019-11-18 NOTE — Anesthesia Preprocedure Evaluation (Signed)
Anesthesia Evaluation  Patient identified by MRN, date of birth, ID band Patient awake    Reviewed: Allergy & Precautions, H&P , NPO status , Patient's Chart, lab work & pertinent test results, reviewed documented beta blocker date and time   History of Anesthesia Complications (+) PONV and history of anesthetic complications  Airway Mallampati: IV  TM Distance: >3 FB Neck ROM: full    Dental no notable dental hx. (+) Caps, Poor Dentition   Pulmonary neg shortness of breath, sleep apnea and Continuous Positive Airway Pressure Ventilation , COPD, neg recent URI, former smoker,           Cardiovascular Exercise Tolerance: Good hypertension, (-) angina(-) CAD, (-) Past MI, (-) Cardiac Stents and (-) CABG (-) dysrhythmias + Valvular Problems/Murmurs (as a child)      Neuro/Psych neg Seizures PSYCHIATRIC DISORDERS (Depression) Anxiety Depression  Neuromuscular disease (Parkinson's disease)    GI/Hepatic Neg liver ROS, GERD  ,  Endo/Other  diabetes  Renal/GU negative Renal ROS  negative genitourinary   Musculoskeletal  (+) Arthritis ,   Abdominal   Peds  Hematology  (+) anemia ,   Anesthesia Other Findings Past Medical History: No date: Anxiety No date: Depression No date: Diabetes mellitus without complication (HCC) No date: Hemorrhoids No date: Hypertension No date: Neuromuscular disorder (HCC) 2014: Parkinson disease (HCC) No date: Sleep apnea     Comment: Uses a C-Pap machine No date: Vertigo   Reproductive/Obstetrics negative OB ROS                             Anesthesia Physical  Anesthesia Plan  ASA: III  Anesthesia Plan: General   Post-op Pain Management:    Induction: Intravenous  PONV Risk Score and Plan: Propofol infusion  Airway Management Planned: Nasal Cannula  Additional Equipment:   Intra-op Plan:   Post-operative Plan:   Informed Consent: I have reviewed  the patients History and Physical, chart, labs and discussed the procedure including the risks, benefits and alternatives for the proposed anesthesia with the patient or authorized representative who has indicated his/her understanding and acceptance.     Dental Advisory Given  Plan Discussed with: Anesthesiologist, CRNA and Surgeon  Anesthesia Plan Comments:         Anesthesia Quick Evaluation

## 2019-11-19 ENCOUNTER — Encounter: Payer: Self-pay | Admitting: Internal Medicine

## 2019-11-19 LAB — SURGICAL PATHOLOGY

## 2020-04-12 ENCOUNTER — Other Ambulatory Visit: Payer: Self-pay | Admitting: Internal Medicine

## 2020-04-12 DIAGNOSIS — Z1231 Encounter for screening mammogram for malignant neoplasm of breast: Secondary | ICD-10-CM

## 2020-04-13 ENCOUNTER — Ambulatory Visit
Admission: RE | Admit: 2020-04-13 | Discharge: 2020-04-13 | Disposition: A | Discharge status: Home / Self Care | Payer: Medicare Other | Source: Ambulatory Visit | Attending: Internal Medicine | Admitting: Internal Medicine

## 2020-04-13 ENCOUNTER — Other Ambulatory Visit: Payer: Self-pay

## 2020-04-13 DIAGNOSIS — Z1231 Encounter for screening mammogram for malignant neoplasm of breast: Secondary | ICD-10-CM | POA: Insufficient documentation

## 2020-04-20 ENCOUNTER — Other Ambulatory Visit: Payer: Self-pay | Admitting: Internal Medicine

## 2020-04-20 DIAGNOSIS — N6489 Other specified disorders of breast: Secondary | ICD-10-CM

## 2020-04-20 DIAGNOSIS — R928 Other abnormal and inconclusive findings on diagnostic imaging of breast: Secondary | ICD-10-CM

## 2020-04-29 ENCOUNTER — Ambulatory Visit
Admission: RE | Admit: 2020-04-29 | Discharge: 2020-04-29 | Disposition: A | Discharge status: Home / Self Care | Payer: Medicare Other | Source: Ambulatory Visit | Attending: Internal Medicine | Admitting: Internal Medicine

## 2020-04-29 ENCOUNTER — Other Ambulatory Visit: Payer: Self-pay

## 2020-04-29 DIAGNOSIS — R928 Other abnormal and inconclusive findings on diagnostic imaging of breast: Secondary | ICD-10-CM | POA: Insufficient documentation

## 2020-04-29 DIAGNOSIS — N6489 Other specified disorders of breast: Secondary | ICD-10-CM | POA: Diagnosis present

## 2021-01-13 ENCOUNTER — Other Ambulatory Visit: Payer: Self-pay | Admitting: Internal Medicine

## 2021-01-13 DIAGNOSIS — E43 Unspecified severe protein-calorie malnutrition: Secondary | ICD-10-CM

## 2021-01-13 DIAGNOSIS — R1084 Generalized abdominal pain: Secondary | ICD-10-CM

## 2021-01-13 DIAGNOSIS — R634 Abnormal weight loss: Secondary | ICD-10-CM

## 2021-02-03 ENCOUNTER — Other Ambulatory Visit: Payer: Self-pay

## 2021-02-03 ENCOUNTER — Ambulatory Visit
Admission: RE | Admit: 2021-02-03 | Discharge: 2021-02-03 | Disposition: A | Discharge status: Home / Self Care | Payer: Medicare Other | Source: Ambulatory Visit | Attending: Internal Medicine | Admitting: Internal Medicine

## 2021-02-03 DIAGNOSIS — R1084 Generalized abdominal pain: Secondary | ICD-10-CM | POA: Diagnosis present

## 2021-02-03 DIAGNOSIS — E43 Unspecified severe protein-calorie malnutrition: Secondary | ICD-10-CM | POA: Insufficient documentation

## 2021-02-03 DIAGNOSIS — R634 Abnormal weight loss: Secondary | ICD-10-CM | POA: Insufficient documentation

## 2021-02-03 MED ORDER — IOHEXOL 350 MG/ML SOLN
75.0000 mL | Freq: Once | INTRAVENOUS | Status: AC | PRN
  Administered 2021-02-03: 75 mL via INTRAVENOUS

## 2021-02-13 ENCOUNTER — Emergency Department: Payer: Medicare Other

## 2021-02-13 ENCOUNTER — Other Ambulatory Visit: Payer: Self-pay

## 2021-02-13 DIAGNOSIS — E876 Hypokalemia: Secondary | ICD-10-CM | POA: Diagnosis not present

## 2021-02-13 DIAGNOSIS — Z79899 Other long term (current) drug therapy: Secondary | ICD-10-CM | POA: Insufficient documentation

## 2021-02-13 DIAGNOSIS — R627 Adult failure to thrive: Secondary | ICD-10-CM | POA: Diagnosis not present

## 2021-02-13 DIAGNOSIS — I10 Essential (primary) hypertension: Secondary | ICD-10-CM | POA: Diagnosis not present

## 2021-02-13 DIAGNOSIS — R0602 Shortness of breath: Secondary | ICD-10-CM | POA: Diagnosis present

## 2021-02-13 DIAGNOSIS — Z87891 Personal history of nicotine dependence: Secondary | ICD-10-CM | POA: Diagnosis not present

## 2021-02-13 DIAGNOSIS — Z20822 Contact with and (suspected) exposure to covid-19: Secondary | ICD-10-CM | POA: Diagnosis not present

## 2021-02-13 DIAGNOSIS — R1314 Dysphagia, pharyngoesophageal phase: Secondary | ICD-10-CM | POA: Diagnosis not present

## 2021-02-13 DIAGNOSIS — Z96611 Presence of right artificial shoulder joint: Secondary | ICD-10-CM | POA: Insufficient documentation

## 2021-02-13 DIAGNOSIS — R251 Tremor, unspecified: Secondary | ICD-10-CM | POA: Insufficient documentation

## 2021-02-13 DIAGNOSIS — J449 Chronic obstructive pulmonary disease, unspecified: Secondary | ICD-10-CM | POA: Insufficient documentation

## 2021-02-13 LAB — CBC
HCT: 37.8 % (ref 36.0–46.0)
Hemoglobin: 13.7 g/dL (ref 12.0–15.0)
MCH: 31.5 pg (ref 26.0–34.0)
MCHC: 36.2 g/dL — ABNORMAL HIGH (ref 30.0–36.0)
MCV: 86.9 fL (ref 80.0–100.0)
Platelets: 173 10*3/uL (ref 150–400)
RBC: 4.35 MIL/uL (ref 3.87–5.11)
RDW: 13.5 % (ref 11.5–15.5)
WBC: 3.6 10*3/uL — ABNORMAL LOW (ref 4.0–10.5)
nRBC: 0 % (ref 0.0–0.2)

## 2021-02-13 LAB — BASIC METABOLIC PANEL
Anion gap: 13 (ref 5–15)
BUN: 16 mg/dL (ref 8–23)
CO2: 24 mmol/L (ref 22–32)
Calcium: 9.7 mg/dL (ref 8.9–10.3)
Chloride: 97 mmol/L — ABNORMAL LOW (ref 98–111)
Creatinine, Ser: 0.74 mg/dL (ref 0.44–1.00)
GFR, Estimated: >60 mL/min (ref >60)
Glucose, Bld: 106 mg/dL — ABNORMAL HIGH (ref 70–99)
Potassium: 2.7 mmol/L — CL (ref 3.5–5.1)
Sodium: 134 mmol/L — ABNORMAL LOW (ref 135–145)

## 2021-02-13 LAB — TROPONIN I (HIGH SENSITIVITY)
Troponin I (High Sensitivity): 5 ng/L (ref <18)
Troponin I (High Sensitivity): 4 ng/L (ref <18)

## 2021-02-13 MED ORDER — POTASSIUM CHLORIDE CRYS ER 20 MEQ PO TBCR
40.0000 meq | EXTENDED_RELEASE_TABLET | Freq: Once | ORAL | Status: DC
  Filled 2021-02-13: qty 2

## 2021-02-13 NOTE — ED Triage Notes (Signed)
Patient c/o SOB, chest discomfort, dizziness. Patient has a hx of parkinson's dementia.

## 2021-02-14 ENCOUNTER — Emergency Department
Admission: EM | Admit: 2021-02-14 | Discharge: 2021-02-14 | Disposition: A | Discharge status: Home / Self Care | Payer: Medicare Other | Attending: Emergency Medicine | Admitting: Emergency Medicine

## 2021-02-14 DIAGNOSIS — R1314 Dysphagia, pharyngoesophageal phase: Secondary | ICD-10-CM

## 2021-02-14 DIAGNOSIS — E876 Hypokalemia: Secondary | ICD-10-CM

## 2021-02-14 DIAGNOSIS — R627 Adult failure to thrive: Secondary | ICD-10-CM

## 2021-02-14 DIAGNOSIS — R0602 Shortness of breath: Secondary | ICD-10-CM

## 2021-02-14 LAB — URINALYSIS, COMPLETE (UACMP) WITH MICROSCOPIC
Bilirubin Urine: NEGATIVE
Glucose, UA: NEGATIVE mg/dL
Hgb urine dipstick: NEGATIVE
Ketones, ur: 40 mg/dL — AB
Leukocytes,Ua: NEGATIVE
Nitrite: NEGATIVE
Protein, ur: 30 mg/dL — AB
Specific Gravity, Urine: 1.025 (ref 1.005–1.030)
pH: 6 (ref 5.0–8.0)

## 2021-02-14 LAB — RESP PANEL BY RT-PCR (FLU A&B, COVID) ARPGX2
Influenza A by PCR: NEGATIVE
Influenza B by PCR: NEGATIVE
SARS Coronavirus 2 by RT PCR: NEGATIVE

## 2021-02-14 LAB — MAGNESIUM: Magnesium: 1.8 mg/dL (ref 1.7–2.4)

## 2021-02-14 MED ORDER — POTASSIUM CHLORIDE 20 MEQ PO PACK
40.0000 meq | PACK | Freq: Once | ORAL | Status: AC
  Administered 2021-02-14: 40 meq via ORAL
  Filled 2021-02-14: qty 2

## 2021-02-14 MED ORDER — SODIUM CHLORIDE 0.9 % IV BOLUS (SEPSIS)
1000.0000 mL | Freq: Once | INTRAVENOUS | Status: AC
  Administered 2021-02-14: 1000 mL via INTRAVENOUS

## 2021-02-14 MED ORDER — POTASSIUM CHLORIDE 20 MEQ/15ML (10%) PO SOLN: 40.0000 meq | Freq: Every day | ORAL | 0 refills | Status: AC

## 2021-02-14 MED ORDER — POTASSIUM CHLORIDE 10 MEQ/100ML IV SOLN
10.0000 meq | INTRAVENOUS | Status: AC
Start: 2021-02-14 — End: 2021-02-14
  Administered 2021-02-14 (×2): 10 meq via INTRAVENOUS
  Filled 2021-02-14 (×2): qty 100

## 2021-02-14 MED ORDER — LORAZEPAM 2 MG/ML IJ SOLN
1.0000 mg | Freq: Once | INTRAMUSCULAR | Status: AC
  Administered 2021-02-14: 1 mg via INTRAVENOUS
  Filled 2021-02-14: qty 1

## 2021-02-14 NOTE — ED Provider Notes (Addendum)
[facility] Emergency Department Provider Note  ____________________________________________   Event Date/Time   First MD Initiated Contact with Patient 02/14/21 0034     (approximate)  I have reviewed the triage vital signs and the nursing notes.   HISTORY  Chief Complaint Shortness of Breath    HPI Lynn Bauer is a 75 y.o. female with history of Parkinson's, hypertension, COPD, sleep apnea who presents to the emergency department her daughter for concerns for increased shaking, shortness of breath, feeling like "I cannot catch my breath", rapid heart rate, dry heaving, dry mouth and difficulty swallowing stating she feels like "things are stuck in my throat".  Patient has had all of these symptoms before.  Daughter reports that she has intermittently had a hard time handling her own secretions but then patient states that she has been able to drink a bottle and a half of water today.  She is followed closely by Dr. Norma Fredrickson with gastroenterology, Dr. Sherryll Burger with neurology and her PCP Dr. Hyacinth Meeker.  She has an appointment to see her primary care doctor in 3 days.  She denies that she has had any vomiting but daughter states she has had dry heaving.  No diarrhea.  She denies any chest pain, abdominal pain.  Daughter reports she was concerned because she has not had much to eat or drink over the past 48 hours and things seem to have progressively worsened.  She has had a significant weight loss but this has been a ongoing for several months.  Patient states that she takes Xanax and she thinks that anxiety is contributing.  States that she is taken the maximum amount of Xanax that she is prescribed.    On review of her records, it appears she last saw Dr. Norma Fredrickson in the office on 02/09/2021.  Patient had complaints of feeling like things were stuck in her throat during that visit.  She had an abdominal CT scan on 02/04/2021 that showed large stool burden in the rectosigmoid  colon suggestive of fecal impaction.  She was placed on a bowel regimen and reports that she is now feeling better from this.  Patient is on omeprazole and Carafate.  She has a documented history of pharyngeal esophageal dysphagia.  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   Depression 11/22/2014   Sore throat 11/16/2014    Past Surgical History:  Procedure Laterality Date   ABDOMINAL HYSTERECTOMY     APPENDECTOMY     COLONOSCOPY  2010   COLONOSCOPY WITH PROPOFOL N/A 02/01/2016   Procedure: COLONOSCOPY WITH PROPOFOL;  Surgeon: Earline Mayotte, MD;  Location: [facility] ENDOSCOPY;  Service: Endoscopy;  Laterality: N/A;   ESOPHAGOGASTRODUODENOSCOPY (EGD) WITH PROPOFOL N/A 11/18/2019   Procedure: ESOPHAGOGASTRODUODENOSCOPY (EGD) WITH PROPOFOL;  Surgeon: [place], Balfour  K, MD;  Location: [facility] ENDOSCOPY;  Service: Gastroenterology;  Laterality: N/A;   HEMORRHOID BANDING N/A 02/01/2016   Procedure: [place];  Surgeon: Earline Mayotte, MD;  Location: [facility] ENDOSCOPY;  Service: Endoscopy;  Laterality: N/A;   JOINT REPLACEMENT Right 2018   reverse shoulder replacement   REVERSE SHOULDER ARTHROPLASTY Right 10/09/2016   Procedure: REVERSE SHOULDER ARTHROPLASTY;  Surgeon: Christena Flake, MD;  Location: [facility] ORS;  Service: Orthopedics;  Laterality: Right;   SHOULDER CLOSED REDUCTION Right 09/28/2016    Procedure: CLOSED REDUCTION SHOULDER;  Surgeon: Christena Flake, MD;  Location: [facility] ORS;  Service: Orthopedics;  Laterality: Right;    Prior to Admission medications   Medication Sig Start Date End Date Taking? Authorizing Provider  potassium chloride 20 MEQ/15ML (10%) SOLN Take 30 mLs (40 mEq total) by mouth daily for 5 days. 02/14/21 02/19/21 Yes Melat Wrisley, Layla Maw, DO  acetaminophen (TYLENOL) 500 MG chewable tablet Chew 500 mg by mouth every 6 (six) hours as needed for pain.    [provider]  ALPRAZolam (XANAX) 0.5 MG tablet TAKE ONE TABLET THREE TIMES DAILY AS NEEDED FOR ANXIETY Patient taking differently: Take 0.5 mg by mouth 3 (three) times daily as needed for anxiety.  08/19/15   Dennison Mascot, MD  atorvastatin (LIPITOR) 20 MG tablet TAKE ONE TABLET AT BEDTIME Patient not taking: Reported on 09/28/2016 05/24/15   Dennison Mascot, MD  azelastine (ASTELIN) 0.1 % nasal spray Place 2 sprays into both nostrils at bedtime. Use in each nostril as directed    [provider]  carbidopa-levodopa (SINEMET CR) 50-200 MG tablet Take 2 tablets by mouth 3 (three) times daily. For 90 days    [provider]  cholecalciferol (VITAMIN D) 1000 units tablet Take 2,000 Units by mouth daily.    [provider]  desipramine (NORPRAMIN) 25 MG tablet Take 25 mg by mouth at bedtime.    [provider]  escitalopram (LEXAPRO) 10 MG tablet Take 10 mg by mouth daily.    [provider]  hydroxypropyl methylcellulose / hypromellose (ISOPTO TEARS / GONIOVISC) 2.5 % ophthalmic solution Place 1 drop into both eyes as needed for dry eyes.    [provider]  hydrOXYzine (ATARAX/VISTARIL) 25 MG tablet Take 25 mg by mouth 3 (three) times daily.    [provider]  ibuprofen (ADVIL,MOTRIN) 200 MG tablet Take 200 mg by mouth every 6 (six) hours as needed for mild pain.    [provider]  LINZESS 145 MCG CAPS capsule TAKE ONE CAPSULE DAILY 10/06/15    Dennison Mascot, MD  metoprolol succinate (TOPROL-XL) 50 MG 24 hr tablet Take 50 mg by mouth daily.    [provider]  Misc Natural Products (FIBER 7 PO) Take 3 tablets by mouth 3 (three) times daily.     [provider]  olmesartan-hydrochlorothiazide (BENICAR HCT) 40-12.5 MG tablet Take 0.25 tablets by mouth nightly    [provider]  omeprazole (PRILOSEC) 20 MG capsule Take 40 mg by mouth daily.     [provider]  oxyCODONE (ROXICODONE) 5 MG immediate release tablet Take 1-2 tablets (5-10 mg total) by mouth every 4 (four) hours as needed for severe pain. 10/10/16   Anson Oregon, PA-C  PARoxetine (PAXIL) 40 MG tablet Take 40 mg by mouth daily.    [provider]  phenylephrine (SUDAFED PE) 10 MG TABS tablet Take 10 mg by mouth every 4 (four) hours as needed.    [provider]  polyethylene glycol (MIRALAX / GLYCOLAX) 17 g packet Take 17 g by mouth daily.    [provider]  psyllium (HYDROCIL/METAMUCIL) 95 % PACK Take 1 packet by mouth 3 (three) times daily as needed for mild constipation.    [provider]  rivastigmine (EXELON) 3 MG capsule Take 3 mg by mouth daily.    [provider]  rivastigmine (EXELON) 6 MG capsule Take 6 mg by mouth 2 (two) times daily with a meal.    [provider]  sucralfate (CARAFATE) 1 g tablet Take 1 g by mouth 4 (four) times daily -  with meals and at bedtime.    [provider]  telmisartan (MICARDIS) 80 MG tablet TAKE 1 TABLET EVERY DAY 04/19/15   Dennison Mascot, MD  vitamin B-12 (CYANOCOBALAMIN) 1000 MCG tablet Take 1,000 mcg by mouth daily.    [provider]    Allergies Erythromycin and Penicillins  Family History  Problem Relation Age of Onset   Breast cancer Neg Hx     Social History Social History   Tobacco Use   Smoking status: Former   Smokeless tobacco: Never   Tobacco comments:    smoked 50 years ago  Substance Use  Topics   Alcohol use: No   Drug use: No    Review of Systems Constitutional: No fever. Eyes: No visual changes. ENT: No sore throat. Cardiovascular: Denies chest pain. Respiratory: + shortness of breath. Gastrointestinal: + nausea, vomiting.  No diarrhea. Genitourinary: Negative for dysuria. Musculoskeletal: Negative for back pain. Skin: Negative for rash. Neurological: Negative for focal weakness or numbness.  ____________________________________________   PHYSICAL EXAM:  VITAL SIGNS: ED Triage Vitals  Enc Vitals Group     BP 02/13/21 1328 117/88     Pulse Rate 02/13/21 1328 70     Resp 02/13/21 1328 (!) 25     Temp 02/13/21 1328 (!) 97.5 F (36.4 C)     Temp Source 02/13/21 1328 Oral     SpO2 02/13/21 1328 98 %     Weight 02/13/21 1329 177 lb 14.6 oz (80.7 kg)     Height 02/13/21 1329 5\' 7"  (1.702 m)     Head Circumference --      Peak Flow --      Pain Score 02/13/21 1329 7     Pain Loc --      Pain Edu? --      Excl. in GC? --    CONSTITUTIONAL: Alert and oriented and responds appropriately to questions.  Elderly, thin HEAD: Normocephalic EYES: Conjunctivae clear, pupils appear equal, EOM appear intact ENT: normal nose; dry mucous membranes NECK: Supple, normal ROM CARD: RRR; S1 and S2 appreciated; no murmurs, no clicks, no rubs, no gallops RESP: Normal chest excursion without splinting or tachypnea; breath sounds clear and equal bilaterally; no wheezes, no rhonchi, no rales, no hypoxia or respiratory distress, speaking full sentences ABD/GI: Normal bowel sounds; non-distended; soft, non-tender, no rebound, no guarding, no peritoneal signs, no hepatosplenomegaly BACK: The back appears normal EXT: Normal ROM in all joints; no deformity noted, no edema; no cyanosis SKIN: Normal color for age and race; warm; no rash on exposed skin NEURO: Moves all extremities equally, normal speech, diffuse tremor PSYCH: The patient's mood and manner are  appropriate.  ____________________________________________   LABS (all labs ordered are listed, but only abnormal results are displayed)  Labs Reviewed  BASIC METABOLIC PANEL - Abnormal; Notable for the following components:      Result Value  Sodium 134 (*)    Potassium 2.7 (*)    Chloride 97 (*)    Glucose, Bld 106 (*)    All other components within normal limits  CBC - Abnormal; Notable for the following components:   WBC 3.6 (*)    MCHC 36.2 (*)    All other components within normal limits  URINALYSIS, COMPLETE (UACMP) WITH MICROSCOPIC - Abnormal; Notable for the following components:   Ketones, ur 40 (*)    Protein, ur 30 (*)    Bacteria, UA RARE (*)    All other components within normal limits  RESP PANEL BY RT-PCR (FLU A&B, COVID) ARPGX2  MAGNESIUM  TROPONIN I (HIGH SENSITIVITY)  TROPONIN I (HIGH SENSITIVITY)   ____________________________________________  EKG   EKG Interpretation  Date/Time:  Monday February 13 2021 13:34:47 EDT Ventricular Rate:  70 PR Interval:  116 QRS Duration: 138 QT Interval:  472 QTC Calculation: 509 R Axis:   108 Text Interpretation: Normal sinus rhythm Right bundle branch block that is new since 2018 Abnormal ECG Confirmed by Rochele Raring [id number]) on 02/14/2021 12:39:06 AM        ____________________________________________  RADIOLOGY Normajean Baxter Giorgia Wahler, personally viewed and evaluated these images (plain radiographs) as part of my medical decision making, as well as reviewing the written report by the radiologist.  ED MD interpretation: Chest x-ray clear.  Official radiology report(s): DG Chest 2 View  Result Date: 02/13/2021 CLINICAL DATA:  Chest pain. EXAM: CHEST - 2 VIEW COMPARISON:  March 25, 2014. FINDINGS: The heart size and mediastinal contours are within normal limits. Both lungs are clear. No visible pleural effusions or pneumothorax. Biapical pleuroparenchymal scarring. Right reverse total shoulder arthroplasty  with probable large humeral head fracture fragment remaining. IMPRESSION: 1. No evidence of acute cardiopulmonary disease. 2. Right reverse total shoulder arthroplasty with probable large humeral head fracture fragment remaining. Dedicated shoulder radiographs could further evaluate if clinically indicated. Electronically Signed   By: Feliberto Harts M.D.   On: 02/13/2021 14:18    ____________________________________________   PROCEDURES  Procedure(s) performed (including Critical Care):  Procedures    ____________________________________________   INITIAL IMPRESSION / ASSESSMENT AND PLAN / ED COURSE  As part of my medical decision making, I reviewed the following data within the electronic MEDICAL RECORD NUMBER History obtained from family, Nursing notes reviewed and incorporated, Labs reviewed , EKG interpreted , Old EKG reviewed, Old chart reviewed, Radiograph reviewed , and Notes from prior ED visits         Patient here with her daughter for concerns for increased shaking, nausea and dry heaving, difficulty breathing and difficulty swallowing.  This seems like an acute exacerbation of a chronic issue for the patient.  She does appear to have dry mucous membranes on exam but is hemodynamically stable.  Denies any chest pain or abdominal pain.  Her labs are actually very reassuring without significant signs of dehydration.  Her potassium is slightly low at 2.7 but normal creatinine and magnesium.  Will give oral and IV potassium.  Her EKG does show a new right bundle branch block when compared to 2018.  She also does have a mild leukopenia.  We will test for COVID to ensure she does not have a viral illness contributing to her symptoms.  Her chest x-ray is clear today.  Her oxygen level is normal.  Her lungs are clear.  I do not think that she has a PE.  This seems atypical for ACS.  Her troponin is negative.  Will give IV fluids, IV Ativan and reassess.  Patient's daughter seems more  concerned than the patient does.  Patient has been requesting to go home.  Discussed with daughter and patient that I would like for the patient to be able to feel better and be able to tolerate p.o. prior to discharge.  It sounds like she is already well plugged in with outpatient follow-up and has an appointment to see her primary doctor in 3 days.  ED PROGRESS  Patient's urine shows small amount of ketones but no sign of infection.  COVID and flu negative.  Patient continues to state that she feels very cold although daughter reports that this is a chronic issue for her and that she is always covered in blankets, sweatshirts and gloves even in the summer.  Her rectal temperature here is 96.9.  She was feeling better after Ativan which helped controlled her shaking which she felt like helped with her breathing and she has been able to eat a graham cracker, applesauce and drink fluids including oral potassium.  She is handling her secretions without difficulty.  I do not think that she has an esophageal food bolus.  Discussed again with patient and family that I think that this is a chronic issue and failure to thrive from her chronic Parkinson's, dementia.  She has follow-up with her primary care doctor in the next few days.  Have offered her admission for continued monitoring and IV hydration which she declined stating again that she wants to go home.  I feel this is very reasonable.  Will discharge with liquid potassium.  Discussed importance of trying to increase fluid intake at home and close outpatient follow-up with her PCP, GI specialist and neurologist.  Patient and daughter verbalized understanding and are comfortable with this plan.  We did discussed return precautions.   At this time, I do not feel there is any life-threatening condition present. I have reviewed, interpreted and discussed all results (EKG, imaging, lab, urine as appropriate) and exam findings with patient/family. I have reviewed  nursing notes and appropriate previous records.  I feel the patient is safe to be discharged home without further emergent workup and can continue workup as an outpatient as needed. Discussed usual and customary return precautions. Patient/family verbalize understanding and are comfortable with this plan.  Outpatient follow-up has been provided as needed. All questions have been answered.  ____________________________________________   FINAL CLINICAL IMPRESSION(S) / ED DIAGNOSES  Final diagnoses:  Pharyngoesophageal dysphagia  Hypokalemia  SOB (shortness of breath)  Failure to thrive in adult     ED Discharge Orders          Ordered    potassium chloride 20 MEQ/15ML (10%) SOLN  Daily        02/14/21 0318            *Please note:  AMELIYA NICOTRA was evaluated in Emergency Department on 02/14/2021 for the symptoms described in the history of present illness. She was evaluated in the context of the global COVID-19 pandemic, which necessitated consideration that the patient might be at risk for infection with the SARS-CoV-2 virus that causes COVID-19. Institutional protocols and algorithms that pertain to the evaluation of patients at risk for COVID-19 are in a state of rapid change based on information released by regulatory bodies including the CDC and federal and state organizations. These policies and algorithms were followed during the patient's care in the ED.  Some ED evaluations and interventions may be delayed  as a result of limited staffing during and the pandemic.*   Note:  This document was prepared using Dragon voice recognition software and may include unintentional dictation errors.    Drury Ardizzone, Layla Maw, DO 02/14/21 0325    Gaje Tennyson, Layla Maw, DO 02/14/21 [phone]

## 2021-03-28 DEATH — deceased

## 2021-07-24 IMAGING — MG MM DIGITAL DIAGNOSTIC UNILAT*L* W/ TOMO W/ CAD
6 series · 6 of 18 positions shown · non-contrast
Comparison: Previous exam(s).

CLINICAL DATA: Recall from screening mammogram for questioned
asymmetry in the left breast.

EXAM:
DIGITAL DIAGNOSTIC LEFT MAMMOGRAM WITH TOMO
ULTRASOUND LEFT BREAST

[L CC synth-2D (1 of 2)]
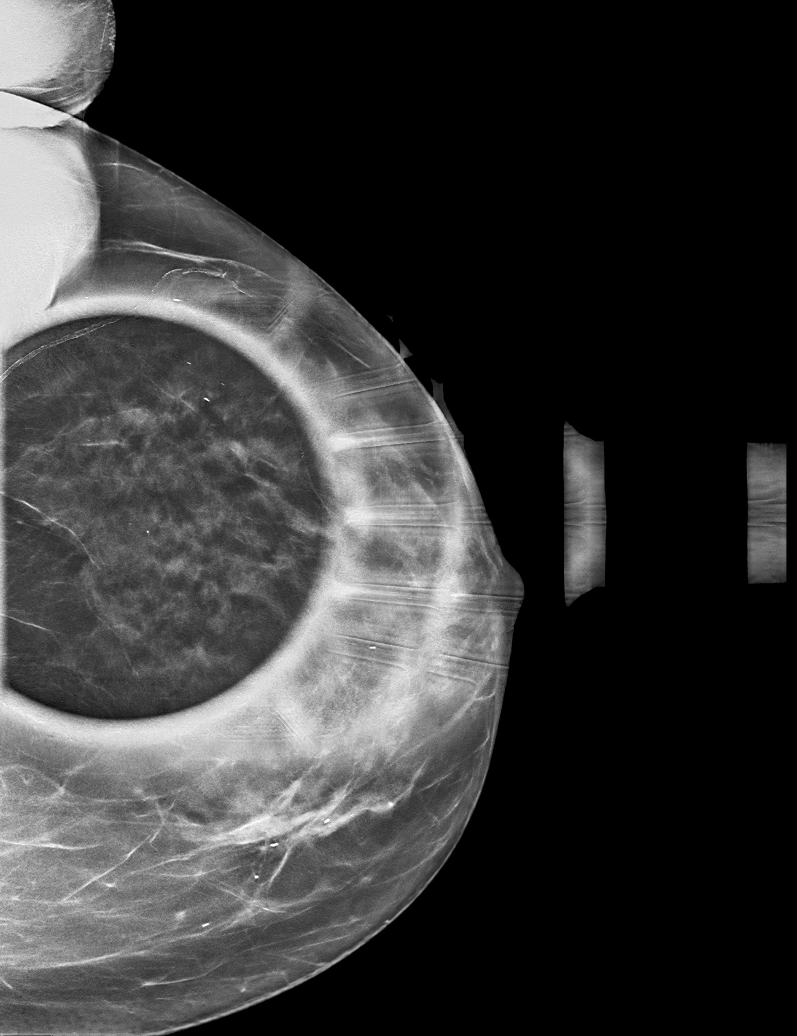

[L MLO synth-2D]
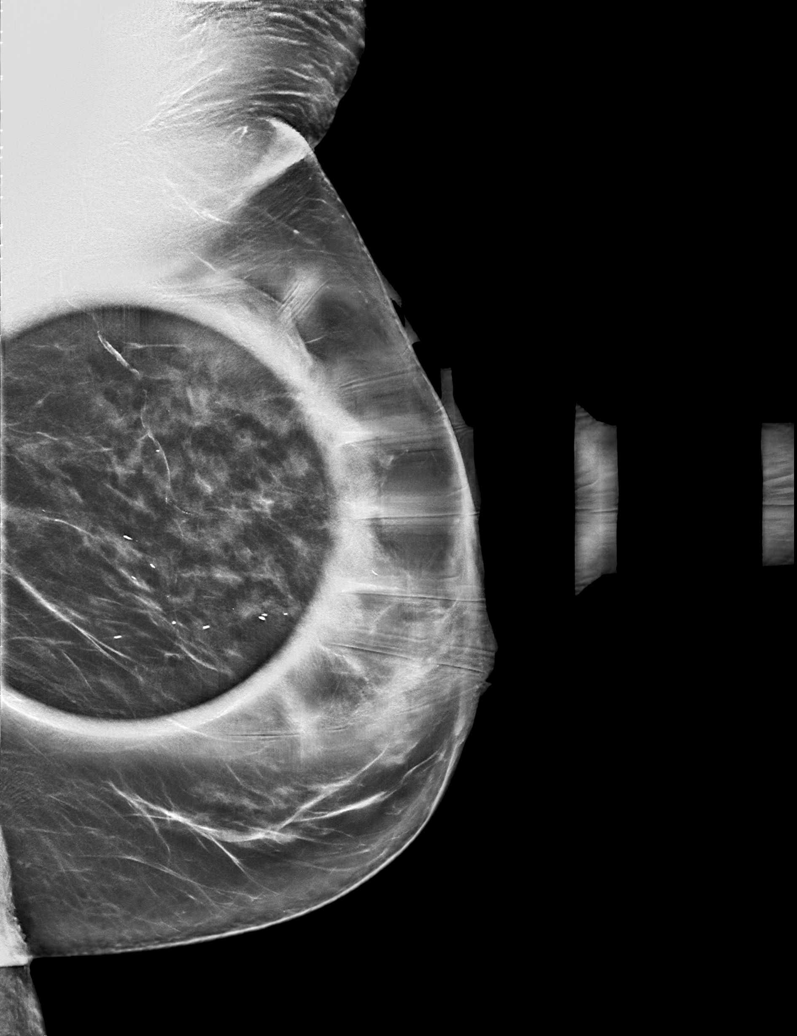

[L CC synth-2D (2 of 2)]
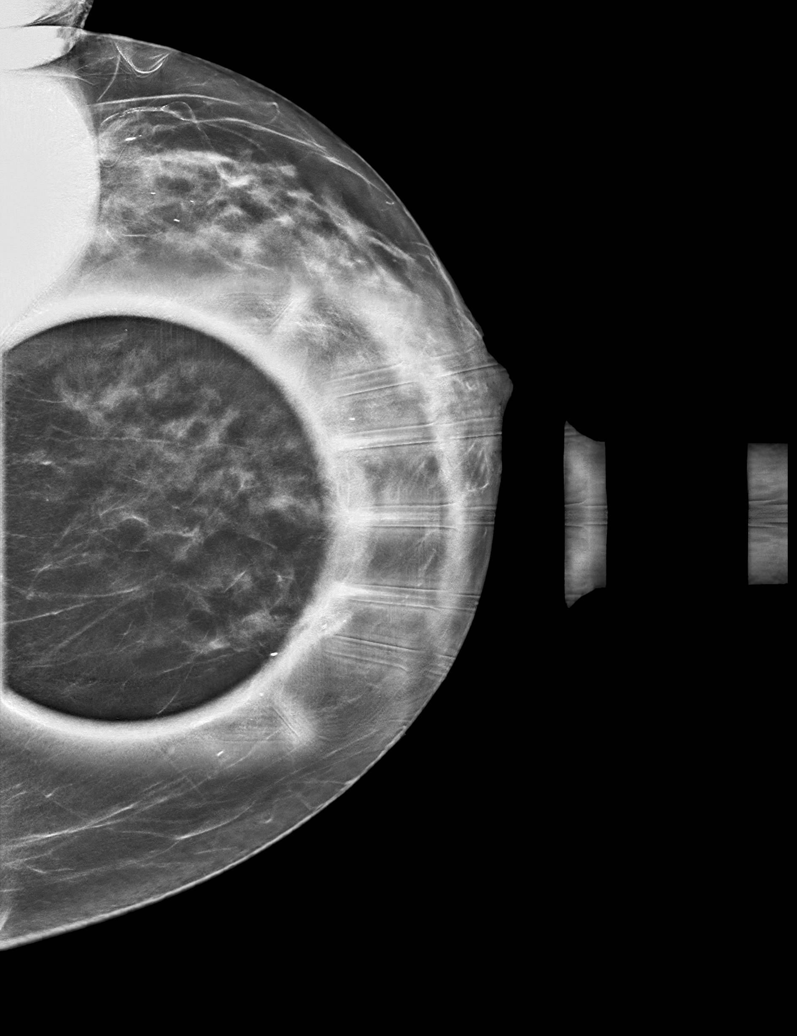

[L CC tomo (1 of 2) · tomo slice 29/57.0]
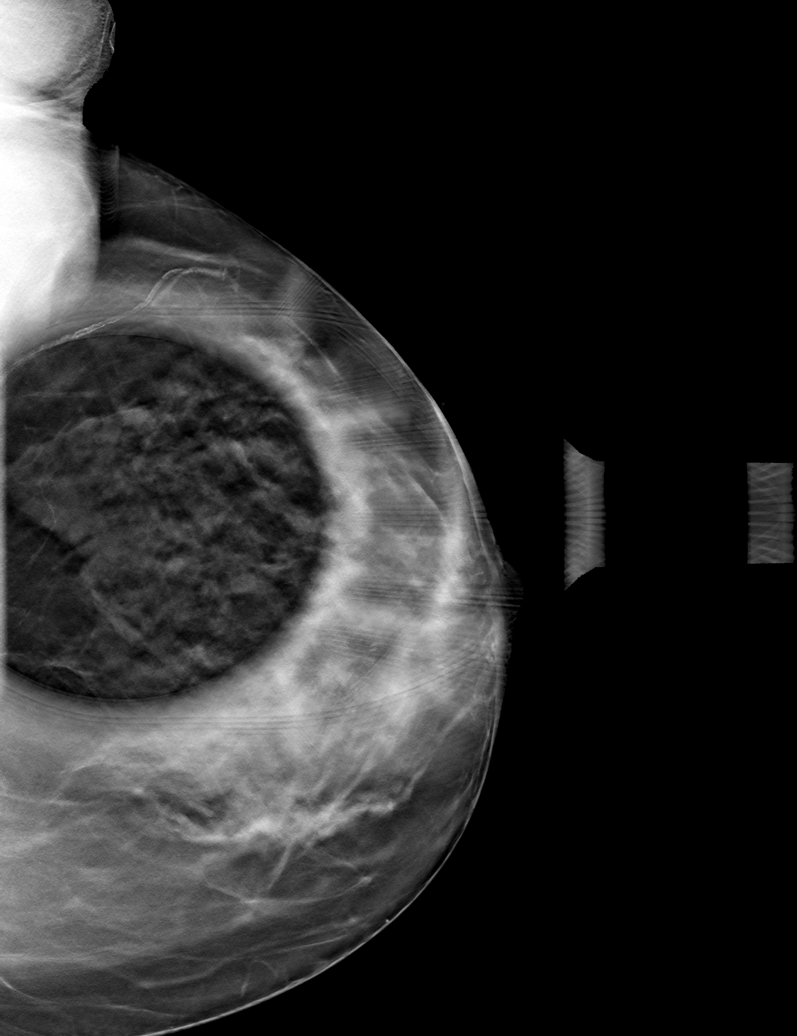

[L CC tomo (2 of 2) · tomo slice 29/58.0]
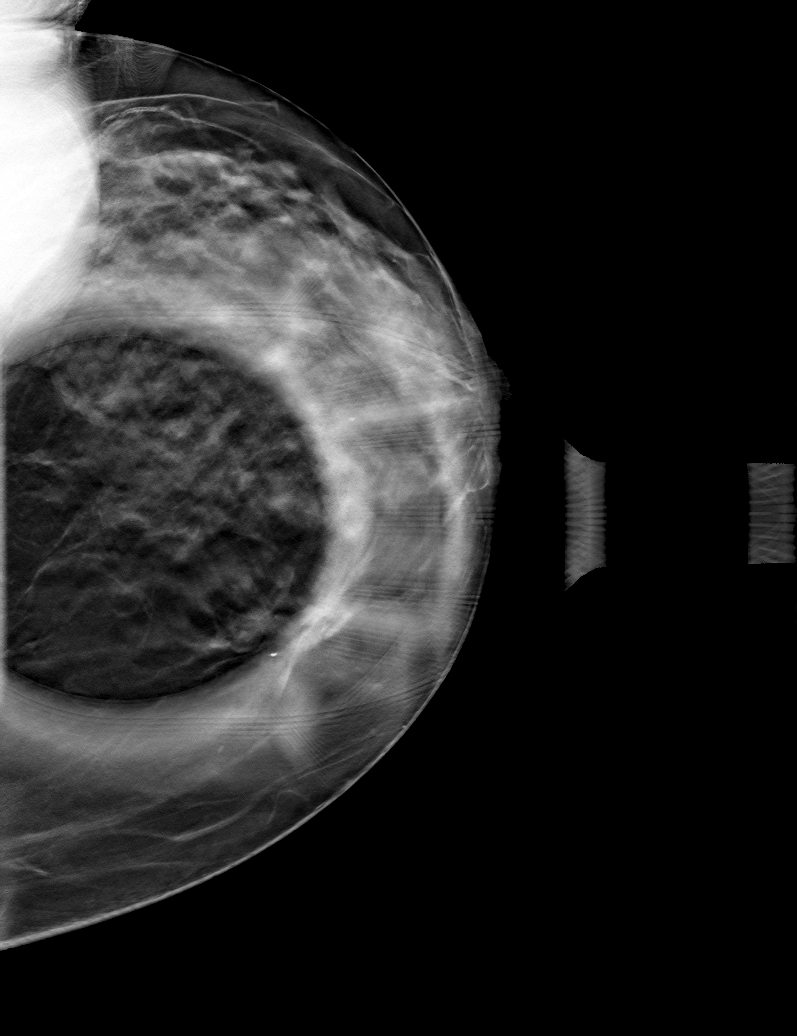

[L MLO tomo · tomo slice 35/69.0]
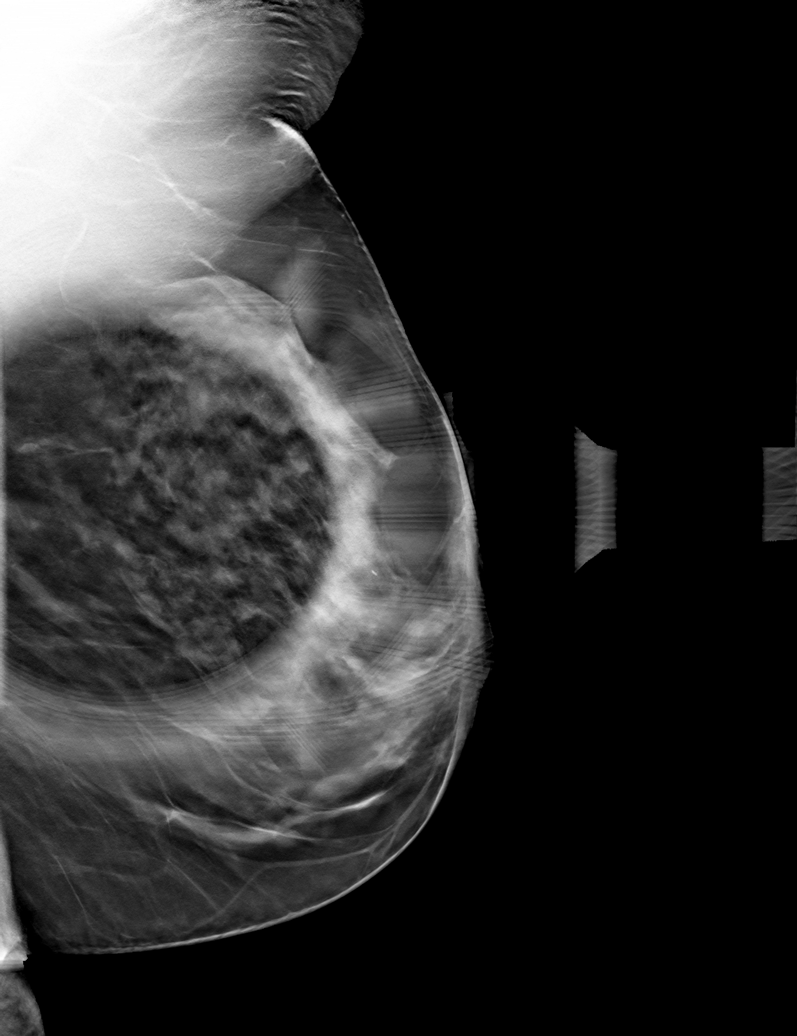

[6 of 18 positions shown; findings below may reference images not displayed]

ACR Breast Density Category c: The breast tissue is heterogeneously
dense, which may obscure small masses.
FINDINGS: The previously described finding does not persist with additional
views, consistent with superimposed fibroglandular tissue. No
suspicious mass, microcalcification, or other finding is identified.

Targeted left breast ultrasound was performed from the 11 o'clock to
the 1 o'clock position in the left breast in the area of the
previously questioned asymmetry. No suspicious solid or cystic mass
is identified.
IMPRESSION: No evidence of malignancy in the left breast.

RECOMMENDATION:
Recommend routine annual screening mammogram in December 2019.

I have discussed the findings and recommendations with the patient.
If applicable, a reminder letter will be sent to the patient
regarding the next appointment.

BI-RADS CATEGORY  1: Negative.
